# Patient Record
Sex: Female | Born: 1955 | Race: White | Hispanic: No | Marital: Married | Smoking: Never smoker
Health system: Southern US, Community
[De-identification: ages and names within clinical notes are randomized; demographics above are authoritative.]

## PROBLEM LIST (undated history)

## (undated) DIAGNOSIS — Z8052 Family history of malignant neoplasm of bladder: Secondary | ICD-10-CM

## (undated) DIAGNOSIS — D649 Anemia, unspecified: Secondary | ICD-10-CM

## (undated) DIAGNOSIS — G629 Polyneuropathy, unspecified: Secondary | ICD-10-CM

## (undated) DIAGNOSIS — L409 Psoriasis, unspecified: Secondary | ICD-10-CM

## (undated) DIAGNOSIS — Z800 Family history of malignant neoplasm of digestive organs: Secondary | ICD-10-CM

## (undated) DIAGNOSIS — E039 Hypothyroidism, unspecified: Secondary | ICD-10-CM

## (undated) DIAGNOSIS — C801 Malignant (primary) neoplasm, unspecified: Secondary | ICD-10-CM

## (undated) DIAGNOSIS — C50919 Malignant neoplasm of unspecified site of unspecified female breast: Secondary | ICD-10-CM

## (undated) DIAGNOSIS — M1990 Unspecified osteoarthritis, unspecified site: Secondary | ICD-10-CM

## (undated) DIAGNOSIS — K219 Gastro-esophageal reflux disease without esophagitis: Secondary | ICD-10-CM

## (undated) DIAGNOSIS — S63599A Other specified sprain of unspecified wrist, initial encounter: Secondary | ICD-10-CM

## (undated) DIAGNOSIS — H269 Unspecified cataract: Secondary | ICD-10-CM

## (undated) DIAGNOSIS — H409 Unspecified glaucoma: Secondary | ICD-10-CM

## (undated) HISTORY — DX: Family history of malignant neoplasm of digestive organs: Z80.0

## (undated) HISTORY — PX: DILATION AND CURETTAGE OF UTERUS: SHX78

## (undated) HISTORY — PX: KNEE SURGERY: SHX244

## (undated) HISTORY — PX: CHOLECYSTECTOMY: SHX55

## (undated) HISTORY — DX: Family history of malignant neoplasm of bladder: Z80.52

## (undated) HISTORY — PX: BREAST SURGERY: SHX581

## (undated) HISTORY — PX: BUNIONECTOMY: SHX129

## (undated) HISTORY — PX: ABDOMINAL HYSTERECTOMY: SHX81

## (undated) HISTORY — PX: KNEE ARTHROSCOPY: SUR90

## (undated) HISTORY — PX: CYST EXCISION: SHX5701

## (undated) HISTORY — DX: Malignant neoplasm of unspecified site of unspecified female breast: C50.919

---

## 1987-07-20 HISTORY — PX: PARTIAL HYSTERECTOMY: SHX80

## 2002-07-19 HISTORY — PX: FOOT SURGERY: SHX648

## 2010-05-05 ENCOUNTER — Encounter
Admission: RE | Admit: 2010-05-05 | Discharge: 2010-07-16 | Payer: Self-pay | Source: Home / Self Care | Attending: Podiatry | Admitting: Podiatry

## 2010-07-19 HISTORY — PX: HAND TENDON SURGERY: SHX663

## 2010-09-17 HISTORY — PX: CHOLECYSTECTOMY: SHX55

## 2011-05-27 ENCOUNTER — Other Ambulatory Visit: Payer: Self-pay | Admitting: Orthopedic Surgery

## 2011-05-27 DIAGNOSIS — M25532 Pain in left wrist: Secondary | ICD-10-CM

## 2011-06-15 ENCOUNTER — Ambulatory Visit
Admission: RE | Admit: 2011-06-15 | Discharge: 2011-06-15 | Disposition: A | Discharge status: Home / Self Care | Payer: BC Managed Care – PPO | Source: Ambulatory Visit | Attending: Orthopedic Surgery | Admitting: Orthopedic Surgery

## 2011-06-15 DIAGNOSIS — M25532 Pain in left wrist: Secondary | ICD-10-CM

## 2011-06-15 MED ORDER — IOHEXOL 180 MG/ML  SOLN
3.0000 mL | Freq: Once | INTRAMUSCULAR | Status: AC | PRN
  Administered 2011-06-15: 3 mL via INTRA_ARTICULAR

## 2011-06-28 ENCOUNTER — Encounter (HOSPITAL_BASED_OUTPATIENT_CLINIC_OR_DEPARTMENT_OTHER): Payer: Self-pay | Admitting: *Deleted

## 2011-06-29 ENCOUNTER — Encounter (HOSPITAL_BASED_OUTPATIENT_CLINIC_OR_DEPARTMENT_OTHER): Payer: Self-pay | Admitting: Anesthesiology

## 2011-06-29 ENCOUNTER — Encounter (HOSPITAL_BASED_OUTPATIENT_CLINIC_OR_DEPARTMENT_OTHER): Payer: Self-pay | Admitting: *Deleted

## 2011-06-29 ENCOUNTER — Encounter (HOSPITAL_BASED_OUTPATIENT_CLINIC_OR_DEPARTMENT_OTHER): Payer: Self-pay | Admitting: Orthopedic Surgery

## 2011-06-29 ENCOUNTER — Ambulatory Visit (HOSPITAL_BASED_OUTPATIENT_CLINIC_OR_DEPARTMENT_OTHER)
Admission: RE | Admit: 2011-06-29 | Discharge: 2011-06-29 | Disposition: A | Discharge status: Home / Self Care | Payer: BC Managed Care – PPO | Source: Ambulatory Visit | Attending: Orthopedic Surgery | Admitting: Orthopedic Surgery

## 2011-06-29 ENCOUNTER — Encounter (HOSPITAL_BASED_OUTPATIENT_CLINIC_OR_DEPARTMENT_OTHER)
Admission: RE | Disposition: A | Discharge status: Home / Self Care | Payer: Self-pay | Source: Ambulatory Visit | Attending: Orthopedic Surgery

## 2011-06-29 ENCOUNTER — Ambulatory Visit (HOSPITAL_BASED_OUTPATIENT_CLINIC_OR_DEPARTMENT_OTHER): Payer: BC Managed Care – PPO | Admitting: Anesthesiology

## 2011-06-29 DIAGNOSIS — K219 Gastro-esophageal reflux disease without esophagitis: Secondary | ICD-10-CM | POA: Insufficient documentation

## 2011-06-29 DIAGNOSIS — Y998 Other external cause status: Secondary | ICD-10-CM | POA: Insufficient documentation

## 2011-06-29 DIAGNOSIS — S63599A Other specified sprain of unspecified wrist, initial encounter: Secondary | ICD-10-CM | POA: Insufficient documentation

## 2011-06-29 DIAGNOSIS — Z5333 Arthroscopic surgical procedure converted to open procedure: Secondary | ICD-10-CM | POA: Insufficient documentation

## 2011-06-29 HISTORY — DX: Other specified sprain of unspecified wrist, initial encounter: S63.599A

## 2011-06-29 HISTORY — DX: Unspecified osteoarthritis, unspecified site: M19.90

## 2011-06-29 HISTORY — DX: Psoriasis, unspecified: L40.9

## 2011-06-29 HISTORY — DX: Gastro-esophageal reflux disease without esophagitis: K21.9

## 2011-06-29 HISTORY — PX: WRIST ARTHROSCOPY: SHX838

## 2011-06-29 LAB — POCT HEMOGLOBIN-HEMACUE: Hemoglobin: 14 g/dL (ref 12.0–15.0)

## 2011-06-29 SURGERY — ARTHROSCOPY, WRIST
Anesthesia: Choice | Site: Wrist | Laterality: Left | Wound class: Clean

## 2011-06-29 MED ORDER — PROPOFOL 10 MG/ML IV EMUL
INTRAVENOUS | Status: DC | PRN
  Administered 2011-06-29: 200 mg via INTRAVENOUS

## 2011-06-29 MED ORDER — FENTANYL CITRATE 0.05 MG/ML IJ SOLN: 25.0000 ug | INTRAMUSCULAR | Status: DC | PRN

## 2011-06-29 MED ORDER — EPHEDRINE SULFATE 50 MG/ML IJ SOLN
INTRAMUSCULAR | Status: DC | PRN
  Administered 2011-06-29: 10 mg via INTRAVENOUS

## 2011-06-29 MED ORDER — ROPIVACAINE HCL 5 MG/ML IJ SOLN
INTRAMUSCULAR | Status: DC | PRN
  Administered 2011-06-29: 25 mL via EPIDURAL

## 2011-06-29 MED ORDER — ONDANSETRON HCL 4 MG/2ML IJ SOLN
INTRAMUSCULAR | Status: DC | PRN
  Administered 2011-06-29: 4 mg via INTRAVENOUS

## 2011-06-29 MED ORDER — MIDAZOLAM HCL 2 MG/2ML IJ SOLN
0.5000 mg | INTRAMUSCULAR | Status: DC | PRN
  Administered 2011-06-29: 2 mg via INTRAVENOUS

## 2011-06-29 MED ORDER — ONDANSETRON HCL 4 MG PO TABS: 4.0000 mg | ORAL_TABLET | Freq: Three times a day (TID) | ORAL | Status: AC | PRN

## 2011-06-29 MED ORDER — LIDOCAINE HCL 1 % IJ SOLN
INTRAMUSCULAR | Status: DC | PRN
  Administered 2011-06-29: 2 mL via INTRADERMAL

## 2011-06-29 MED ORDER — ONDANSETRON HCL 4 MG PO TABS
4.0000 mg | ORAL_TABLET | Freq: Once | ORAL | Status: AC
  Administered 2011-06-29: 4 mg via ORAL

## 2011-06-29 MED ORDER — METOCLOPRAMIDE HCL 5 MG/ML IJ SOLN
10.0000 mg | Freq: Once | INTRAMUSCULAR | Status: AC | PRN
  Administered 2011-06-29: 10 mg via INTRAVENOUS

## 2011-06-29 MED ORDER — HYDROCODONE-ACETAMINOPHEN 5-325 MG PO TABS: ORAL_TABLET | ORAL | Status: DC

## 2011-06-29 MED ORDER — DEXAMETHASONE SODIUM PHOSPHATE 10 MG/ML IJ SOLN
INTRAMUSCULAR | Status: DC | PRN
  Administered 2011-06-29: 10 mg via INTRAVENOUS

## 2011-06-29 MED ORDER — LACTATED RINGERS IV SOLN
INTRAVENOUS | Status: DC
  Administered 2011-06-29 (×3): via INTRAVENOUS

## 2011-06-29 MED ORDER — FENTANYL CITRATE 0.05 MG/ML IJ SOLN
50.0000 ug | INTRAMUSCULAR | Status: DC | PRN
  Administered 2011-06-29: 100 ug via INTRAVENOUS

## 2011-06-29 SURGICAL SUPPLY — 91 items
ANCHOR JUGGERKNOT SHT 1.4 SOFT (Anchor) ×2 IMPLANT
BANDAGE ACE 4 STERILE (GAUZE/BANDAGES/DRESSINGS) ×2 IMPLANT
BANDAGE ELASTIC 3 VELCRO ST LF (GAUZE/BANDAGES/DRESSINGS) ×2 IMPLANT
BANDAGE ELASTIC 4 VELCRO ST LF (GAUZE/BANDAGES/DRESSINGS) IMPLANT
BANDAGE GAUZE ELAST BULKY 4 IN (GAUZE/BANDAGES/DRESSINGS) ×2 IMPLANT
BLADE CUDA 2.0 (BLADE) IMPLANT
BLADE EAR TYMPAN 2.5 60D BEAV (BLADE) IMPLANT
BLADE MINI RND TIP GREEN BEAV (BLADE) IMPLANT
BLADE SURG 15 STRL LF DISP TIS (BLADE) ×3 IMPLANT
BLADE SURG 15 STRL SS (BLADE) ×3
BNDG ELASTIC 2 VLCR STRL LF (GAUZE/BANDAGES/DRESSINGS) IMPLANT
BNDG ESMARK 4X9 LF (GAUZE/BANDAGES/DRESSINGS) ×2 IMPLANT
BNDG PLASTER X FAST 3X3 WHT LF (CAST SUPPLIES) ×2 IMPLANT
BUR CUDA 2.9 (BURR) IMPLANT
BUR FULL RADIUS 2.0 (BURR) IMPLANT
BUR FULL RADIUS 2.9 (BURR) IMPLANT
BUR GATOR 2.9 (BURR) IMPLANT
BUR SPHERICAL 2.9 (BURR) IMPLANT
CANISTER OMNI JUG 16 LITER (MISCELLANEOUS) ×2 IMPLANT
CANISTER SUCTION 1200CC (MISCELLANEOUS) IMPLANT
CANISTER SUCTION 2500CC (MISCELLANEOUS) IMPLANT
CHLORAPREP W/TINT 26ML (MISCELLANEOUS) ×2 IMPLANT
CLOTH BEACON ORANGE TIMEOUT ST (SAFETY) ×2 IMPLANT
CORDS BIPOLAR (ELECTRODE) ×2 IMPLANT
COVER MAYO STAND STRL (DRAPES) ×2 IMPLANT
COVER TABLE BACK 60X90 (DRAPES) ×2 IMPLANT
CUFF TOURNIQUET SINGLE 18IN (TOURNIQUET CUFF) IMPLANT
DRAPE EXTREMITY T 121X128X90 (DRAPE) ×2 IMPLANT
DRAPE OEC MINIVIEW 54X84 (DRAPES) IMPLANT
DRAPE SURG 17X23 STRL (DRAPES) ×2 IMPLANT
DRSG KUZMA FLUFF (GAUZE/BANDAGES/DRESSINGS) IMPLANT
ELECT SMALL JOINT 90D BASC (ELECTRODE) IMPLANT
GAUZE XEROFORM 1X8 LF (GAUZE/BANDAGES/DRESSINGS) ×2 IMPLANT
GLOVE BIO SURGEON STRL SZ 6 (GLOVE) ×2 IMPLANT
GLOVE BIO SURGEON STRL SZ 6.5 (GLOVE) ×2 IMPLANT
GLOVE BIO SURGEON STRL SZ7.5 (GLOVE) ×2 IMPLANT
GLOVE BIOGEL PI IND STRL 6.5 (GLOVE) ×1 IMPLANT
GLOVE BIOGEL PI INDICATOR 6.5 (GLOVE) ×1
GLOVE SURG ORTHO 8.0 STRL STRW (GLOVE) ×4 IMPLANT
GOWN PREVENTION PLUS XLARGE (GOWN DISPOSABLE) ×4 IMPLANT
GOWN STRL REIN XL XLG (GOWN DISPOSABLE) ×4 IMPLANT
IV NS IRRIG 3000ML ARTHROMATIC (IV SOLUTION) ×2 IMPLANT
IV SET EXTENSION GRAVITY 40 LF (IV SETS) ×2 IMPLANT
NDL SAFETY ECLIPSE 18X1.5 (NEEDLE) ×3 IMPLANT
NEEDLE FILTER BLUNT 18X 1/2SAF (NEEDLE)
NEEDLE FILTER BLUNT 18X1 1/2 (NEEDLE) IMPLANT
NEEDLE HYPO 18GX1.5 SHARP (NEEDLE) ×3
NEEDLE HYPO 22GX1.5 SAFETY (NEEDLE) ×2 IMPLANT
NEEDLE SPNL 18GX3.5 QUINCKE PK (NEEDLE) IMPLANT
NEEDLE TUOHY 20GX3.5 (NEEDLE) IMPLANT
NS IRRIG 1000ML POUR BTL (IV SOLUTION) IMPLANT
PACK BASIN DAY SURGERY FS (CUSTOM PROCEDURE TRAY) ×2 IMPLANT
PAD CAST 3X4 CTTN HI CHSV (CAST SUPPLIES) ×1 IMPLANT
PADDING CAST ABS 3INX4YD NS (CAST SUPPLIES) ×2
PADDING CAST ABS 4INX4YD NS (CAST SUPPLIES) ×1
PADDING CAST ABS COTTON 3X4 (CAST SUPPLIES) ×2 IMPLANT
PADDING CAST ABS COTTON 4X4 ST (CAST SUPPLIES) ×1 IMPLANT
PADDING CAST COTTON 3X4 STRL (CAST SUPPLIES) ×1
PADDING WEBRIL 3 STERILE (GAUZE/BANDAGES/DRESSINGS) ×2 IMPLANT
ROUTER HOODED VORTEX 2.9MM (BLADE) IMPLANT
SET ARTHROSCOPY TUBING (MISCELLANEOUS) ×1
SET ARTHROSCOPY TUBING LN (MISCELLANEOUS) ×1 IMPLANT
SET SM JOINT TUBING/CANN (CANNULA) IMPLANT
SLEEVE SCD COMPRESS KNEE MED (MISCELLANEOUS) ×2 IMPLANT
SPLINT PLASTER CAST XFAST 3X15 (CAST SUPPLIES) IMPLANT
SPLINT PLASTER CAST XFAST 4X15 (CAST SUPPLIES) IMPLANT
SPLINT PLASTER XTRA FAST SET 4 (CAST SUPPLIES)
SPLINT PLASTER XTRA FASTSET 3X (CAST SUPPLIES)
SPONGE GAUZE 4X4 12PLY (GAUZE/BANDAGES/DRESSINGS) ×2 IMPLANT
STOCKINETTE 4X48 STRL (DRAPES) ×2 IMPLANT
STRIP CLOSURE SKIN 1/2X4 (GAUZE/BANDAGES/DRESSINGS) IMPLANT
STRIP CLOSURE SKIN 1/4X4 (GAUZE/BANDAGES/DRESSINGS) IMPLANT
SUCTION FRAZIER TIP 10 FR DISP (SUCTIONS) IMPLANT
SUT ETHILON 4 0 PS 2 18 (SUTURE) ×2 IMPLANT
SUT ETHILON 5 0 P 3 18 (SUTURE)
SUT MERSILENE 4 0 P 3 (SUTURE) IMPLANT
SUT NYLON ETHILON 5-0 P-3 1X18 (SUTURE) IMPLANT
SUT PDS AB 2-0 CT2 27 (SUTURE) IMPLANT
SUT STEEL 4 0 (SUTURE) IMPLANT
SUT VIC AB 2-0 PS2 27 (SUTURE) IMPLANT
SUT VICRYL 3-0 RB1 (SUTURE) ×2 IMPLANT
SUT VICRYL 4-0 PS2 18IN ABS (SUTURE) IMPLANT
SUT VICRYL RAPID 5 0 P 3 (SUTURE) IMPLANT
SUT VICRYL RAPIDE 4/0 PS 2 (SUTURE) IMPLANT
SYR BULB 3OZ (MISCELLANEOUS) ×2 IMPLANT
SYR CONTROL 10ML LL (SYRINGE) ×2 IMPLANT
TRAP DIGIT (INSTRUMENTS) ×2 IMPLANT
TUBE CONNECTING 20X1/4 (TUBING) ×2 IMPLANT
UNDERPAD 30X30 INCONTINENT (UNDERPADS AND DIAPERS) ×2 IMPLANT
WAND 1.5 MICROBLATOR (SURGICAL WAND) IMPLANT
WATER STERILE IRR 1000ML POUR (IV SOLUTION) ×2 IMPLANT

## 2011-06-29 NOTE — Op Note (Signed)
Dictation [id number]

## 2011-06-29 NOTE — Progress Notes (Signed)
Assisted Dr. Frederick with left, ultrasound guided, supraclavicular block. Side rails up, monitors on throughout procedure. See vital signs in flow sheet. Tolerated Procedure well. 

## 2011-06-29 NOTE — Brief Op Note (Signed)
06/29/2011  3:34 PM  PATIENT:  Buel Ream  55 y.o. female  PRE-OPERATIVE DIAGNOSIS:  left wrist tfcc tear  POST-OPERATIVE DIAGNOSIS:  left wrist tfcc tear  PROCEDURE:  Procedure(s): ARTHROSCOPY WRIST  SURGEON:  Surgeon(s): Tami Ribas Nicki Reaper, MD  PHYSICIAN ASSISTANT:   ASSISTANTS: none   ANESTHESIA:   regional and general  EBL:  Total I/O In: 1700 [I.V.:1700] Out: -   BLOOD ADMINISTERED:none  DRAINS: none   LOCAL MEDICATIONS USED:  NONE  SPECIMEN:  No Specimen  DISPOSITION OF SPECIMEN:  N/A  COUNTS:  YES  TOURNIQUET:   Total Tourniquet Time Documented: Upper Arm (Left) - 44 minutes  DICTATION: .Other Dictation: Dictation Number [id number]  PLAN OF CARE: Discharge to home after PACU  PATIENT DISPOSITION:  PACU - hemodynamically stable.
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
## 2011-06-29 NOTE — Anesthesia Postprocedure Evaluation (Signed)
Anesthesia Post Note  Patient: Betty Henry  Procedure(s) Performed:  ARTHROSCOPY WRIST - left wrist tfcc repair  Anesthesia type: General  Patient location: PACU  Post pain: Pain level controlled  Post assessment: Patient's Cardiovascular Status Stable  Last Vitals:  Filed Vitals:   06/29/11 1614  BP: 142/87  Pulse: 75  Temp: 36.8 C  Resp: 16    Post vital signs: Reviewed and stable  Level of consciousness: alert  Complications: No apparent anesthesia complications

## 2011-06-29 NOTE — Anesthesia Preprocedure Evaluation (Signed)
Anesthesia Evaluation  Patient identified by MRN, date of birth, ID band Patient awake    Reviewed: Allergy & Precautions, H&P , NPO status , Patient's Chart, lab work & pertinent test results, reviewed documented beta blocker date and time   Airway Mallampati: II TM Distance: >3 FB Neck ROM: full    Dental   Pulmonary neg pulmonary ROS,          Cardiovascular neg cardio ROS     Neuro/Psych Negative Neurological ROS  Negative Psych ROS   GI/Hepatic Neg liver ROS, GERD-  ,  Endo/Other  Negative Endocrine ROS  Renal/GU negative Renal ROS  Genitourinary negative   Musculoskeletal   Abdominal   Peds  Hematology negative hematology ROS (+)   Anesthesia Other Findings See surgeon's H&P   Reproductive/Obstetrics negative OB ROS                           Anesthesia Physical Anesthesia Plan  ASA: II  Anesthesia Plan: General   Post-op Pain Management: MAC Combined w/ Regional for Post-op pain   Induction: Intravenous  Airway Management Planned: LMA  Additional Equipment:   Intra-op Plan:   Post-operative Plan: Extubation in OR  Informed Consent: I have reviewed the patients History and Physical, chart, labs and discussed the procedure including the risks, benefits and alternatives for the proposed anesthesia with the patient or authorized representative who has indicated his/her understanding and acceptance.     Plan Discussed with: CRNA and Surgeon  Anesthesia Plan Comments:         Anesthesia Quick Evaluation

## 2011-06-29 NOTE — Anesthesia Procedure Notes (Addendum)
Anesthesia Regional Block:  Supraclavicular block  Pre-Anesthetic Checklist: ,, timeout performed, Correct Patient, Correct Site, Correct Laterality, Correct Procedure, Correct Position, site marked, Risks and benefits discussed,  Surgical consent,  Pre-op evaluation,  At surgeon's request and post-op pain management  Laterality: Left  Prep: chloraprep       Needles:   Needle Type: Other   (Arrow Echogenic)   Needle Length: 9cm  Needle Gauge: 21    Additional Needles:  Procedures: ultrasound guided Supraclavicular block Narrative:  Start time: 06/29/2011 11:32 AM End time: 06/29/2011 1:40 PM Injection made incrementally with aspirations every 5 mL.  Performed by: Personally  Anesthesiologist: cfrederick  Additional Notes: Ultrasound guidance used to: id relevant anatomy, confirm needle position, local anesthetic spread, avoidance of vascular puncture. Picture saved. No complications.   Ultrasound guidance used to: id relevant anatomy, confirm needle position, local anesthetic spread, avoidance of vascular puncture. Picture saved. No complications. Block performed personally by Janetta Hora. Gelene Mink, MD    Supraclavicular block Procedure Name: LMA Insertion Date/Time: 06/29/2011 2:06 PM Performed by: Gladys Damme Pre-anesthesia Checklist: Patient identified, Timeout performed, Emergency Drugs available, Suction available and Patient being monitored Patient Re-evaluated:Patient Re-evaluated prior to inductionOxygen Delivery Method: Circle System Utilized Preoxygenation: Pre-oxygenation with 100% oxygen Intubation Type: IV induction Ventilation: Mask ventilation without difficulty LMA: LMA with gastric port inserted LMA Size: 4.0 Number of attempts: 1 Placement Confirmation: breath sounds checked- equal and bilateral and positive ETCO2 Tube secured with: Tape Dental Injury: Teeth and Oropharynx as per pre-operative assessment

## 2011-06-29 NOTE — Transfer of Care (Signed)
Immediate Anesthesia Transfer of Care Note  Patient: Betty Henry  Procedure(s) Performed:  ARTHROSCOPY WRIST - left wrist tfcc repair  Patient Location: PACU  Anesthesia Type: GA combined with regional for post-op pain  Level of Consciousness: awake and alert   Airway & Oxygen Therapy: Patient Spontanous Breathing and Patient connected to face mask oxygen  Post-op Assessment: Report given to PACU RN and Post -op Vital signs reviewed and stable  Post vital signs: Reviewed and stable  Complications: No apparent anesthesia complications
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
## 2011-07-01 ENCOUNTER — Encounter (HOSPITAL_BASED_OUTPATIENT_CLINIC_OR_DEPARTMENT_OTHER): Payer: Self-pay | Admitting: Orthopedic Surgery

## 2015-05-21 ENCOUNTER — Other Ambulatory Visit: Payer: Self-pay | Admitting: General Surgery

## 2015-06-04 ENCOUNTER — Other Ambulatory Visit: Payer: Self-pay | Admitting: General Surgery

## 2015-06-04 DIAGNOSIS — Z170 Estrogen receptor positive status [ER+]: Principal | ICD-10-CM

## 2015-06-04 DIAGNOSIS — C50911 Malignant neoplasm of unspecified site of right female breast: Secondary | ICD-10-CM

## 2015-06-06 ENCOUNTER — Other Ambulatory Visit: Payer: Self-pay | Admitting: General Surgery

## 2015-06-18 ENCOUNTER — Other Ambulatory Visit: Payer: Self-pay | Admitting: General Surgery

## 2015-06-18 ENCOUNTER — Other Ambulatory Visit: Payer: Self-pay | Admitting: Oncology

## 2015-06-18 DIAGNOSIS — C50919 Malignant neoplasm of unspecified site of unspecified female breast: Secondary | ICD-10-CM

## 2015-06-20 ENCOUNTER — Ambulatory Visit
Admission: RE | Admit: 2015-06-20 | Discharge: 2015-06-20 | Disposition: A | Discharge status: Home / Self Care | Payer: BLUE CROSS/BLUE SHIELD | Source: Ambulatory Visit | Attending: Oncology | Admitting: Oncology

## 2015-06-20 DIAGNOSIS — C50919 Malignant neoplasm of unspecified site of unspecified female breast: Secondary | ICD-10-CM

## 2015-06-20 MED ORDER — GADOBENATE DIMEGLUMINE 529 MG/ML IV SOLN
20.0000 mL | Freq: Once | INTRAVENOUS | Status: AC | PRN
  Administered 2015-06-20: 20 mL via INTRAVENOUS

## 2015-06-24 ENCOUNTER — Encounter (HOSPITAL_BASED_OUTPATIENT_CLINIC_OR_DEPARTMENT_OTHER): Payer: Self-pay | Admitting: *Deleted

## 2015-06-24 ENCOUNTER — Encounter (HOSPITAL_BASED_OUTPATIENT_CLINIC_OR_DEPARTMENT_OTHER): Payer: Self-pay | Admitting: Orthopedic Surgery

## 2015-06-27 ENCOUNTER — Encounter (HOSPITAL_BASED_OUTPATIENT_CLINIC_OR_DEPARTMENT_OTHER)
Admission: RE | Disposition: A | Discharge status: Home / Self Care | Payer: Self-pay | Source: Ambulatory Visit | Attending: General Surgery

## 2015-06-27 ENCOUNTER — Ambulatory Visit (HOSPITAL_BASED_OUTPATIENT_CLINIC_OR_DEPARTMENT_OTHER): Payer: BLUE CROSS/BLUE SHIELD | Admitting: Certified Registered"

## 2015-06-27 ENCOUNTER — Ambulatory Visit (HOSPITAL_BASED_OUTPATIENT_CLINIC_OR_DEPARTMENT_OTHER)
Admission: RE | Admit: 2015-06-27 | Discharge: 2015-06-27 | Disposition: A | Discharge status: Home / Self Care | Payer: BLUE CROSS/BLUE SHIELD | Source: Ambulatory Visit | Attending: General Surgery | Admitting: General Surgery

## 2015-06-27 ENCOUNTER — Ambulatory Visit (HOSPITAL_COMMUNITY): Payer: BLUE CROSS/BLUE SHIELD

## 2015-06-27 ENCOUNTER — Encounter (HOSPITAL_BASED_OUTPATIENT_CLINIC_OR_DEPARTMENT_OTHER): Payer: Self-pay | Admitting: Certified Registered"

## 2015-06-27 DIAGNOSIS — K219 Gastro-esophageal reflux disease without esophagitis: Secondary | ICD-10-CM | POA: Insufficient documentation

## 2015-06-27 DIAGNOSIS — C50911 Malignant neoplasm of unspecified site of right female breast: Secondary | ICD-10-CM | POA: Insufficient documentation

## 2015-06-27 DIAGNOSIS — L409 Psoriasis, unspecified: Secondary | ICD-10-CM | POA: Insufficient documentation

## 2015-06-27 DIAGNOSIS — M1990 Unspecified osteoarthritis, unspecified site: Secondary | ICD-10-CM | POA: Insufficient documentation

## 2015-06-27 DIAGNOSIS — Z6841 Body Mass Index (BMI) 40.0 and over, adult: Secondary | ICD-10-CM | POA: Diagnosis not present

## 2015-06-27 DIAGNOSIS — Z79899 Other long term (current) drug therapy: Secondary | ICD-10-CM | POA: Insufficient documentation

## 2015-06-27 DIAGNOSIS — E039 Hypothyroidism, unspecified: Secondary | ICD-10-CM | POA: Insufficient documentation

## 2015-06-27 DIAGNOSIS — Z90711 Acquired absence of uterus with remaining cervical stump: Secondary | ICD-10-CM | POA: Insufficient documentation

## 2015-06-27 DIAGNOSIS — Z9049 Acquired absence of other specified parts of digestive tract: Secondary | ICD-10-CM | POA: Diagnosis not present

## 2015-06-27 DIAGNOSIS — Z791 Long term (current) use of non-steroidal anti-inflammatories (NSAID): Secondary | ICD-10-CM | POA: Diagnosis not present

## 2015-06-27 DIAGNOSIS — Z95828 Presence of other vascular implants and grafts: Secondary | ICD-10-CM

## 2015-06-27 HISTORY — DX: Malignant (primary) neoplasm, unspecified: C80.1

## 2015-06-27 HISTORY — PX: PORTACATH PLACEMENT: SHX2246

## 2015-06-27 HISTORY — DX: Hypothyroidism, unspecified: E03.9

## 2015-06-27 SURGERY — INSERTION, TUNNELED CENTRAL VENOUS DEVICE, WITH PORT
Anesthesia: General | Site: Neck | Laterality: Left

## 2015-06-27 MED ORDER — HEPARIN SOD (PORK) LOCK FLUSH 100 UNIT/ML IV SOLN
INTRAVENOUS | Status: DC | PRN
  Administered 2015-06-27: 500 [IU] via INTRAVENOUS

## 2015-06-27 MED ORDER — HEPARIN (PORCINE) IN NACL 2-0.9 UNIT/ML-% IJ SOLN
INTRAMUSCULAR | Status: AC
  Filled 2015-06-27: qty 500

## 2015-06-27 MED ORDER — FENTANYL CITRATE (PF) 100 MCG/2ML IJ SOLN
25.0000 ug | INTRAMUSCULAR | Status: DC | PRN
  Administered 2015-06-27 (×2): 50 ug via INTRAVENOUS

## 2015-06-27 MED ORDER — FENTANYL CITRATE (PF) 100 MCG/2ML IJ SOLN
50.0000 ug | INTRAMUSCULAR | Status: DC | PRN
  Administered 2015-06-27 (×2): 50 ug via INTRAVENOUS

## 2015-06-27 MED ORDER — CEFAZOLIN SODIUM-DEXTROSE 2-3 GM-% IV SOLR
2.0000 g | INTRAVENOUS | Status: AC
  Administered 2015-06-27: 2 g via INTRAVENOUS

## 2015-06-27 MED ORDER — LIDOCAINE HCL (CARDIAC) 20 MG/ML IV SOLN
INTRAVENOUS | Status: DC | PRN
  Administered 2015-06-27: 80 mg via INTRAVENOUS

## 2015-06-27 MED ORDER — SODIUM BICARBONATE 4 % IV SOLN
INTRAVENOUS | Status: AC
  Filled 2015-06-27: qty 5

## 2015-06-27 MED ORDER — HEPARIN SOD (PORK) LOCK FLUSH 100 UNIT/ML IV SOLN
INTRAVENOUS | Status: AC
  Filled 2015-06-27: qty 5

## 2015-06-27 MED ORDER — CHLORHEXIDINE GLUCONATE 4 % EX LIQD: 1.0000 "application " | Freq: Once | CUTANEOUS | Status: DC

## 2015-06-27 MED ORDER — MIDAZOLAM HCL 2 MG/2ML IJ SOLN
1.0000 mg | INTRAMUSCULAR | Status: DC | PRN
  Administered 2015-06-27: 2 mg via INTRAVENOUS

## 2015-06-27 MED ORDER — LIDOCAINE HCL (PF) 1 % IJ SOLN
INTRAMUSCULAR | Status: AC
  Filled 2015-06-27: qty 30

## 2015-06-27 MED ORDER — HYDROCODONE-ACETAMINOPHEN 5-325 MG PO TABS: ORAL_TABLET | ORAL | Status: DC

## 2015-06-27 MED ORDER — ONDANSETRON HCL 4 MG/2ML IJ SOLN
INTRAMUSCULAR | Status: DC | PRN
  Administered 2015-06-27: 4 mg via INTRAVENOUS

## 2015-06-27 MED ORDER — PROPOFOL 10 MG/ML IV BOLUS
INTRAVENOUS | Status: DC | PRN
  Administered 2015-06-27: 200 mg via INTRAVENOUS

## 2015-06-27 MED ORDER — GLYCOPYRROLATE 0.2 MG/ML IJ SOLN: 0.2000 mg | Freq: Once | INTRAMUSCULAR | Status: DC | PRN

## 2015-06-27 MED ORDER — DEXTROSE 5 % IV SOLN: 3.0000 g | INTRAVENOUS | Status: DC

## 2015-06-27 MED ORDER — HEPARIN (PORCINE) IN NACL 2-0.9 UNIT/ML-% IJ SOLN
INTRAMUSCULAR | Status: DC | PRN
  Administered 2015-06-27: 1 via INTRAVENOUS

## 2015-06-27 MED ORDER — LACTATED RINGERS IV SOLN
INTRAVENOUS | Status: DC
  Administered 2015-06-27 (×2): via INTRAVENOUS

## 2015-06-27 MED ORDER — SCOPOLAMINE 1 MG/3DAYS TD PT72: 1.0000 | MEDICATED_PATCH | Freq: Once | TRANSDERMAL | Status: DC

## 2015-06-27 MED ORDER — DEXAMETHASONE SODIUM PHOSPHATE 4 MG/ML IJ SOLN
INTRAMUSCULAR | Status: DC | PRN
  Administered 2015-06-27: 10 mg via INTRAVENOUS

## 2015-06-27 MED ORDER — BUPIVACAINE-EPINEPHRINE 0.25% -1:200000 IJ SOLN
INTRAMUSCULAR | Status: DC | PRN
  Administered 2015-06-27: 25 mL

## 2015-06-27 MED ORDER — PROMETHAZINE HCL 25 MG/ML IJ SOLN
6.2500 mg | INTRAMUSCULAR | Status: DC | PRN
  Administered 2015-06-27: 6.25 mg via INTRAVENOUS

## 2015-06-27 MED ORDER — BUPIVACAINE-EPINEPHRINE (PF) 0.25% -1:200000 IJ SOLN
INTRAMUSCULAR | Status: AC
  Filled 2015-06-27: qty 30

## 2015-06-27 SURGICAL SUPPLY — 38 items
BAG DECANTER FOR FLEXI CONT (MISCELLANEOUS) ×3 IMPLANT
BANDAGE ADH SHEER 1  50/CT (GAUZE/BANDAGES/DRESSINGS) ×3 IMPLANT
BLADE SURG 15 STRL LF DISP TIS (BLADE) ×1 IMPLANT
BLADE SURG 15 STRL SS (BLADE) ×2
CHLORAPREP W/TINT 26ML (MISCELLANEOUS) ×3 IMPLANT
COVER BACK TABLE 60X90IN (DRAPES) ×3 IMPLANT
COVER MAYO STAND STRL (DRAPES) ×3 IMPLANT
DECANTER SPIKE VIAL GLASS SM (MISCELLANEOUS) IMPLANT
DRAPE C-ARM 42X72 X-RAY (DRAPES) ×3 IMPLANT
DRAPE LAPAROTOMY T 102X78X121 (DRAPES) ×3 IMPLANT
DRAPE UTILITY XL STRL (DRAPES) ×3 IMPLANT
ELECT REM PT RETURN 9FT ADLT (ELECTROSURGICAL) ×3
ELECTRODE REM PT RTRN 9FT ADLT (ELECTROSURGICAL) ×1 IMPLANT
GLOVE BIOGEL PI IND STRL 8 (GLOVE) ×1 IMPLANT
GLOVE BIOGEL PI INDICATOR 8 (GLOVE) ×2
GLOVE ECLIPSE 7.5 STRL STRAW (GLOVE) ×3 IMPLANT
GOWN STRL REUS W/ TWL LRG LVL3 (GOWN DISPOSABLE) ×1 IMPLANT
GOWN STRL REUS W/ TWL XL LVL3 (GOWN DISPOSABLE) ×1 IMPLANT
GOWN STRL REUS W/TWL LRG LVL3 (GOWN DISPOSABLE) ×2
GOWN STRL REUS W/TWL XL LVL3 (GOWN DISPOSABLE) ×2
IV CATH PLACEMENT UNIT 16 GA (IV SOLUTION) IMPLANT
IV KIT MINILOC 20X1 SAFETY (NEEDLE) IMPLANT
KIT PORT POWER 8FR ISP CVUE (Catheter) ×3 IMPLANT
LIQUID BAND (GAUZE/BANDAGES/DRESSINGS) ×3 IMPLANT
NEEDLE HYPO 22GX1.5 SAFETY (NEEDLE) ×3 IMPLANT
NEEDLE HYPO 25X1 1.5 SAFETY (NEEDLE) ×3 IMPLANT
PACK BASIN DAY SURGERY FS (CUSTOM PROCEDURE TRAY) ×3 IMPLANT
PENCIL BUTTON HOLSTER BLD 10FT (ELECTRODE) ×3 IMPLANT
SET SHEATH INTRODUCER 10FR (MISCELLANEOUS) IMPLANT
SHEATH COOK PEEL AWAY SET 9F (SHEATH) IMPLANT
SLEEVE SCD COMPRESS KNEE MED (MISCELLANEOUS) IMPLANT
SUT MON AB 4-0 PC3 18 (SUTURE) ×3 IMPLANT
SUT PROLENE 2 0 CT2 30 (SUTURE) ×3 IMPLANT
SUT SILK 4 0 TIES 17X18 (SUTURE) IMPLANT
SYR 5ML LUER SLIP (SYRINGE) ×3 IMPLANT
SYR CONTROL 10ML LL (SYRINGE) ×3 IMPLANT
TOWEL OR 17X24 6PK STRL BLUE (TOWEL DISPOSABLE) ×6 IMPLANT
TOWEL OR NON WOVEN STRL DISP B (DISPOSABLE) ×3 IMPLANT

## 2015-06-27 NOTE — Anesthesia Preprocedure Evaluation (Signed)
Anesthesia Evaluation  Patient identified by MRN, date of birth, ID band Patient awake    Reviewed: Allergy & Precautions, NPO status , Patient's Chart, lab work & pertinent test results  Airway Mallampati: II  TM Distance: >3 FB Neck ROM: Full    Dental no notable dental hx.    Pulmonary neg pulmonary ROS,    Pulmonary exam normal breath sounds clear to auscultation       Cardiovascular negative cardio ROS Normal cardiovascular exam Rhythm:Regular Rate:Normal     Neuro/Psych negative neurological ROS  negative psych ROS   GI/Hepatic Neg liver ROS, GERD  Medicated,  Endo/Other  Hypothyroidism Morbid obesity  Renal/GU negative Renal ROS  negative genitourinary   Musculoskeletal negative musculoskeletal ROS (+)   Abdominal   Peds negative pediatric ROS (+)  Hematology negative hematology ROS (+)   Anesthesia Other Findings   Reproductive/Obstetrics negative OB ROS                             Anesthesia Physical Anesthesia Plan  ASA: III  Anesthesia Plan: General   Post-op Pain Management:    Induction: Intravenous  Airway Management Planned: LMA  Additional Equipment:   Intra-op Plan:   Post-operative Plan: Extubation in OR  Informed Consent: I have reviewed the patients History and Physical, chart, labs and discussed the procedure including the risks, benefits and alternatives for the proposed anesthesia with the patient or authorized representative who has indicated his/her understanding and acceptance.   Dental advisory given  Plan Discussed with: CRNA and Surgeon  Anesthesia Plan Comments:         Anesthesia Quick Evaluation

## 2015-06-27 NOTE — Discharge Instructions (Signed)

## 2015-06-27 NOTE — Transfer of Care (Signed)
Immediate Anesthesia Transfer of Care Note  Patient: Betty Henry  Procedure(s) Performed: Procedure(s): INSERTION PORT-A-CATH (Left)  Patient Location: PACU  Anesthesia Type:General  Level of Consciousness: awake, alert  and patient cooperative  Airway & Oxygen Therapy: Patient Spontanous Breathing and Patient connected to face mask oxygen  Post-op Assessment: Report given to RN, Post -op Vital signs reviewed and stable and Patient moving all extremities  Post vital signs: Reviewed and stable  Last Vitals:  Filed Vitals:   06/27/15 0710  BP: 152/76  Pulse: 59  Temp: 36.4 C  Resp: 20    Complications: No apparent anesthesia complications

## 2015-06-27 NOTE — Anesthesia Procedure Notes (Signed)
Procedure Name: LMA Insertion Date/Time: 06/27/2015 8:27 AM Performed by: Baxter Flattery Pre-anesthesia Checklist: Patient identified, Emergency Drugs available, Suction available and Patient being monitored Patient Re-evaluated:Patient Re-evaluated prior to inductionOxygen Delivery Method: Circle System Utilized Preoxygenation: Pre-oxygenation with 100% oxygen Intubation Type: IV induction Ventilation: Mask ventilation without difficulty LMA: LMA inserted LMA Size: 4.0 Number of attempts: 1 Airway Equipment and Method: Bite block Placement Confirmation: positive ETCO2 and breath sounds checked- equal and bilateral Tube secured with: Tape Dental Injury: Teeth and Oropharynx as per pre-operative assessment

## 2015-06-27 NOTE — Anesthesia Postprocedure Evaluation (Signed)
Anesthesia Post Note  Patient: Betty Henry  Procedure(s) Performed: Procedure(s) (LRB): INSERTION PORT-A-CATH (Left)  Patient location during evaluation: PACU Anesthesia Type: General Level of consciousness: awake and alert Pain management: pain level controlled Vital Signs Assessment: post-procedure vital signs reviewed and stable Respiratory status: spontaneous breathing and respiratory function stable Cardiovascular status: blood pressure returned to baseline and stable Postop Assessment: no signs of nausea or vomiting Anesthetic complications: no    Last Vitals:  Filed Vitals:   06/27/15 0914 06/27/15 0915  BP: 139/71 133/78  Pulse: 92 89  Temp: 36.4 C   Resp: 16 16    Last Pain:  Filed Vitals:   06/27/15 0926  PainSc: 0-No pain                 [name] S

## 2015-06-27 NOTE — Op Note (Signed)
Preoperative diagnosis: Cancer of the breast and the poor venous access  Postoperative diagnosis: Same  Procedure: Placement of ClearVue subcutaneous venous port  Surgeon: Excell Seltzer M.D.  Anesthesia: LMA general  Description of procedure: Patient is brought to the operating room and placed in the supine position on the operating table. IV sedation was administered. The entire upper chest and neck were widely sterilely prepped and draped. Local anesthesia was used to infiltrate the insertion of port site. The left subclavian vein was cannulated with a needle and guidewire without difficulty and position in the superior vena cava was confirmed by fluoroscopy. The introducer was then placed over the guidewire and the flushed catheter placed via the introducer which was stripped away and the tip of the catheter positioned near the cavoatrial junction. A small transverse incision was made in the anterior chest wall and subcutaneous pocket created. The catheter was tunneled into the pocket, trimmed to length, and attached to the flushed port which was positioned in the pocket. The port was sutured to the chest wall with interrupted 2-0 Prolene. The incisions were closed with subcutaneous interrupted Monocryl and the skin incisions closed with subcuticular Monocryl and Dermabond. The port was accessed and flushed and aspirated easily and was left flushed with concentrated heparin solution. Sponge needle as the counts were correct. The patient was taken to recovery in good condition.  Jari Carollo T  06/27/2015

## 2015-06-27 NOTE — H&P (Signed)
Betty Henry is an 59 y.o. female.    HPI:  Patient has a recent diagnosis of triple negative invasive ductal carcinoma of the right breast. After consultation with medical oncology we have elected to proceed with neoadjuvant chemotherapy. She presents for Port-A-Cath placement.  Past Medical History  Diagnosis Date  . Arthritis     knees  . Psoriasis     elbows, knees  . Achilles tendon pain   . TFCC (triangular fibrocartilage complex) tear     left  . Hypothyroidism   . GERD (gastroesophageal reflux disease)   . Cancer ([place])     right breast  . Psoriasis     bil legs, elbows and hands    Past Surgical History  Procedure Laterality Date  . Cholecystectomy  09/2010  . Knee surgery  2000, 2008    left  . Foot surgery  2004    left  . Partial hysterectomy  1989  . Wrist arthroscopy  06/29/2011    Procedure: ARTHROSCOPY WRIST;  Surgeon: Tennis Must;  Location: [facility];  Service: Orthopedics;  Laterality: Left;  left wrist tfcc repair  . Cholecystectomy    . Abdominal hysterectomy      partial  . Bunionectomy Left   . Cyst excision Left     wrist  . Hand tendon surgery Left 2012  . Knee arthroscopy Left     x2    History reviewed. No pertinent family history. Social History:  reports that she has never smoked. She does not have any smokeless tobacco history on file. She reports that she does not drink alcohol or use illicit drugs.  Allergies:  Allergies  Allergen Reactions  . Ciprofloxacin Other (See Comments)    Abdominal cramping  . Codeine Nausea And Vomiting    Can take with nausea med  . Adhesive [Tape] Rash  . Codeine Nausea And Vomiting    Medications Prior to Admission  Medication Sig Dispense Refill  . acetaminophen (TYLENOL) 500 MG tablet Take 500 mg by mouth every 6 (six) hours as needed.    . cholecalciferol (VITAMIN D) 1000 UNITS tablet Take 1,000 Units by mouth daily.    Marland Kitchen esomeprazole (NEXIUM) 40 MG capsule Take 40 mg by mouth  daily at 12 noon.    Marland Kitchen HYDROcodone-acetaminophen (NORCO) 5-325 MG per tablet 1-2 tabs po q6 hours prn pain 40 tablet 0  . levothyroxine (SYNTHROID, LEVOTHROID) 75 MCG tablet Take 75 mcg by mouth daily before breakfast.    . loperamide (IMODIUM A-D) 2 MG tablet Take 2 mg by mouth as needed for diarrhea or loose stools.    . Vitamin D, Ergocalciferol, (DRISDOL) 50000 UNITS CAPS Take 50,000 Units by mouth every 7 (seven) days.      . diclofenac sodium (VOLTAREN) 1 % GEL Apply topically.      . meloxicam (MOBIC) 15 MG tablet Take 15 mg by mouth daily.    . tazarotene (AVAGE) 0.1 % cream Apply topically at bedtime.        No results found for this or any previous visit (from the past 48 hour(s)). No results found.  Review of Systems  Constitutional: Negative for fever.  Respiratory: Negative.   Cardiovascular: Negative.     Blood pressure 152/76, pulse 59, temperature 97.6 F (36.4 C), temperature source Oral, resp. rate 20, height 5\' 1"  (1.549 m), weight 103.42 kg (228 lb), SpO2 99 %. Physical Exam  General: Obese otherwise well-appearing Caucasian female Skin: No rash or infection  Lungs: Clear equal breath sounds bilaterally. Cardiac: Regular rate and rhythm. Extremities: No swelling or deformity Neurologic: Alert and fully oriented and affect normal  Assessment/Plan New diagnosis clinical stage I triple negative cancer of the right breast. For neoadjuvant chemotherapy. Presents for Port-A-Cath placement. I discussed the procedure with the patient including its indications and nature as well as risks of anesthetic complications, bleeding, infection, pneumothorax and long-term risks of catheter malfunction, occlusion and DVT. She understands and agrees to proceed.  Clementina Mareno T 06/27/2015, 8:15 AM

## 2015-06-30 ENCOUNTER — Encounter (HOSPITAL_BASED_OUTPATIENT_CLINIC_OR_DEPARTMENT_OTHER): Payer: Self-pay | Admitting: General Surgery

## 2015-07-20 DIAGNOSIS — Z9221 Personal history of antineoplastic chemotherapy: Secondary | ICD-10-CM

## 2015-07-20 DIAGNOSIS — C50919 Malignant neoplasm of unspecified site of unspecified female breast: Secondary | ICD-10-CM

## 2015-07-20 DIAGNOSIS — Z923 Personal history of irradiation: Secondary | ICD-10-CM

## 2015-07-20 HISTORY — DX: Malignant neoplasm of unspecified site of unspecified female breast: C50.919

## 2015-07-20 HISTORY — PX: BREAST LUMPECTOMY: SHX2

## 2015-07-20 HISTORY — DX: Personal history of irradiation: Z92.3

## 2015-07-20 HISTORY — DX: Personal history of antineoplastic chemotherapy: Z92.21

## 2015-10-14 ENCOUNTER — Other Ambulatory Visit: Payer: Self-pay | Admitting: General Surgery

## 2015-10-14 DIAGNOSIS — C50411 Malignant neoplasm of upper-outer quadrant of right female breast: Secondary | ICD-10-CM

## 2015-11-03 ENCOUNTER — Ambulatory Visit
Admission: RE | Admit: 2015-11-03 | Discharge: 2015-11-03 | Disposition: A | Discharge status: Home / Self Care | Payer: BLUE CROSS/BLUE SHIELD | Source: Ambulatory Visit | Attending: General Surgery | Admitting: General Surgery

## 2015-11-03 DIAGNOSIS — C50411 Malignant neoplasm of upper-outer quadrant of right female breast: Secondary | ICD-10-CM

## 2015-11-03 MED ORDER — GADOBENATE DIMEGLUMINE 529 MG/ML IV SOLN
20.0000 mL | Freq: Once | INTRAVENOUS | Status: AC | PRN
  Administered 2015-11-03: 20 mL via INTRAVENOUS

## 2015-11-21 ENCOUNTER — Other Ambulatory Visit: Payer: Self-pay | Admitting: General Surgery

## 2015-11-21 DIAGNOSIS — C50411 Malignant neoplasm of upper-outer quadrant of right female breast: Secondary | ICD-10-CM

## 2015-11-27 ENCOUNTER — Other Ambulatory Visit: Payer: Self-pay | Admitting: General Surgery

## 2015-11-27 DIAGNOSIS — C50411 Malignant neoplasm of upper-outer quadrant of right female breast: Secondary | ICD-10-CM

## 2015-12-08 ENCOUNTER — Encounter (HOSPITAL_BASED_OUTPATIENT_CLINIC_OR_DEPARTMENT_OTHER): Payer: Self-pay | Admitting: *Deleted

## 2015-12-10 ENCOUNTER — Ambulatory Visit
Admission: RE | Admit: 2015-12-10 | Discharge: 2015-12-10 | Disposition: A | Discharge status: Home / Self Care | Payer: BLUE CROSS/BLUE SHIELD | Source: Ambulatory Visit | Attending: General Surgery | Admitting: General Surgery

## 2015-12-10 DIAGNOSIS — C50411 Malignant neoplasm of upper-outer quadrant of right female breast: Secondary | ICD-10-CM

## 2015-12-10 NOTE — Pre-Procedure Instructions (Signed)
Pt in to pick up boost breeze drink and instructions reviewed. Pt supplied copies of her EKG and labs, placed in chart.

## 2015-12-11 NOTE — H&P (Signed)
  History of Present Illness Marland Kitchen T. Tunisia Landgrebe MD; 11/21/2015 1:33 PM) The patient is a 60 year old female who presents with breast cancer. She returns for surgical treatment planning status post neoadjuvant chemotherapy for triple negative cancer of the right breast. She is a premenopausal female who was referred by Dr. Theda Sers for evaluation of 4 carcinoma of the right breast. She initially had some pain in her left breast and was referred for imaging which revealed a new 2 cm mass in the right breast. She underwent core biopsy revealing a triple negative cancer. Lymph nodes clinically negative. She subsequently has completed her chemotherapy 2 weeks ago. 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  She subsequently has had an MRI on November 03, 2015 which has shown complete imaging resolution of the previously seen mass in the posterior upper outer quadrant of the right breast. Lymph nodes appear normal.      Allergies (Sonya Bynum, CMA; 11/21/2015 11:41 AM) Codeine and Related Cipro *FLUOROQUINOLONES* Adhesive Bandages Fexible *MEDICAL DEVICES AND SUPPLIES* Codeine Phosphate *ANALGESICS - OPIOID*  Medication History (Sonya Bynum, CMA; 11/21/2015 11:42 AM) Vitamin D (Cholecalciferol) (400UNIT Tablet Chewable, Oral) Active. Tylenol (500MG  Capsule, Oral) Active. Phenazopyridine HCl (200MG  Tablet, Oral) Active. NexIUM (40MG  Capsule DR, Oral) Active. Levothyroxine Sodium (75MCG Tablet, Oral) Active. Meloxicam (15MG  Tablet, Oral) Active. Gabapentin (300MG  Capsule, Oral) Active. LevoFLOXacin (750MG  Tablet, Oral) Active. RifAMPin (150MG  Capsule, Oral) Active. Medications Reconciled  Vitals (Sonya Bynum CMA; 11/21/2015 11:41 AM) 11/21/2015 11:41 AM Weight: 226 lb Height: 61in Body Surface Area: 1.99 m Body Mass Index: 42.7 kg/m  Temp.: 97.61F(Temporal)  Pulse: 81 (Regular)   BP: 136/80 (Sitting, Left Arm, Standard)       Physical Exam Marland Kitchen T. Genetta Fiero MD; 11/21/2015 1:35 PM) The physical exam findings are as follows: Note:General: Alert, obese Caucasian female, in no distress Skin: Warm and dry without rash or infection. HEENT: No palpable masses or thyromegaly. Sclera nonicteric. Pupils equal round and reactive. Lymph nodes: No cervical, supraclavicular, nodes palpable. Breasts: No palpable masses in either breast. No palpable axillary adenopathy Lungs: Breath sounds clear and equal. No wheezing or increased work of breathing. Cardiovascular: Regular rate and rhythm without murmer. No JVD or edema. Abdomen: Nondistended. Soft and nontender. No masses palpable. No organomegaly. No palpable hernias. Extremities: No edema or joint swelling or deformity. No chronic venous stasis changes. Neurologic: Alert and fully oriented. Gait normal. No focal weakness. Psychiatric: Normal mood and affect. Thought content appropriate with normal judgement and insight    Assessment & Plan Marland Kitchen T. 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 We discussed proceeding with surgery in the next 2-4 weeks. We discussed radioactive seed localized right breast lumpectomy and right axillary sentinel lymph node biopsy. We may remove her Port-A-Cath if her oncologist desires. We discussed the nature of surgery and expected recovery as well as risks of anesthetic complications, bleeding, infection, lymphedema and possible need for further surgery based on final margins. All her questions were answered.  Current Plans Radioactive seed localized right breast lumpectomy and right axillary sentinel  lymph node biopsy and possible removal of Port-A-Cath under general anesthesia as an outpatient

## 2015-12-12 ENCOUNTER — Ambulatory Visit (HOSPITAL_BASED_OUTPATIENT_CLINIC_OR_DEPARTMENT_OTHER): Payer: BLUE CROSS/BLUE SHIELD | Admitting: Anesthesiology

## 2015-12-12 ENCOUNTER — Ambulatory Visit (HOSPITAL_BASED_OUTPATIENT_CLINIC_OR_DEPARTMENT_OTHER)
Admission: RE | Admit: 2015-12-12 | Discharge: 2015-12-12 | Disposition: A | Discharge status: Home / Self Care | Payer: BLUE CROSS/BLUE SHIELD | Source: Ambulatory Visit | Attending: General Surgery | Admitting: General Surgery

## 2015-12-12 ENCOUNTER — Ambulatory Visit
Admission: RE | Admit: 2015-12-12 | Discharge: 2015-12-12 | Disposition: A | Discharge status: Home / Self Care | Payer: BLUE CROSS/BLUE SHIELD | Source: Ambulatory Visit | Attending: General Surgery | Admitting: General Surgery

## 2015-12-12 ENCOUNTER — Encounter (HOSPITAL_BASED_OUTPATIENT_CLINIC_OR_DEPARTMENT_OTHER)
Admission: RE | Disposition: A | Discharge status: Home / Self Care | Payer: Self-pay | Source: Ambulatory Visit | Attending: General Surgery

## 2015-12-12 ENCOUNTER — Encounter (HOSPITAL_BASED_OUTPATIENT_CLINIC_OR_DEPARTMENT_OTHER): Payer: Self-pay | Admitting: *Deleted

## 2015-12-12 ENCOUNTER — Encounter (HOSPITAL_COMMUNITY)
Admission: RE | Admit: 2015-12-12 | Discharge: 2015-12-12 | Disposition: A | Discharge status: Home / Self Care | Payer: BLUE CROSS/BLUE SHIELD | Source: Ambulatory Visit | Attending: General Surgery | Admitting: General Surgery

## 2015-12-12 DIAGNOSIS — C50411 Malignant neoplasm of upper-outer quadrant of right female breast: Secondary | ICD-10-CM | POA: Insufficient documentation

## 2015-12-12 DIAGNOSIS — N6031 Fibrosclerosis of right breast: Secondary | ICD-10-CM | POA: Insufficient documentation

## 2015-12-12 HISTORY — PX: BREAST LUMPECTOMY WITH RADIOACTIVE SEED AND SENTINEL LYMPH NODE BIOPSY: SHX6550

## 2015-12-12 HISTORY — PX: PORT-A-CATH REMOVAL: SHX5289

## 2015-12-12 SURGERY — BREAST LUMPECTOMY WITH RADIOACTIVE SEED AND SENTINEL LYMPH NODE BIOPSY
Anesthesia: General | Site: Chest | Laterality: Right

## 2015-12-12 MED ORDER — SODIUM CHLORIDE 0.9 % IJ SOLN
INTRAMUSCULAR | Status: AC
  Filled 2015-12-12: qty 10

## 2015-12-12 MED ORDER — LIDOCAINE HCL (CARDIAC) 20 MG/ML IV SOLN
INTRAVENOUS | Status: DC | PRN
  Administered 2015-12-12: 60 mg via INTRAVENOUS

## 2015-12-12 MED ORDER — ONDANSETRON HCL 4 MG/2ML IJ SOLN
INTRAMUSCULAR | Status: AC
  Filled 2015-12-12: qty 2

## 2015-12-12 MED ORDER — EPHEDRINE 5 MG/ML INJ
INTRAVENOUS | Status: AC
  Filled 2015-12-12: qty 10

## 2015-12-12 MED ORDER — FENTANYL CITRATE (PF) 100 MCG/2ML IJ SOLN
INTRAMUSCULAR | Status: AC
  Filled 2015-12-12: qty 2

## 2015-12-12 MED ORDER — TECHNETIUM TC 99M SULFUR COLLOID FILTERED
1.0000 | Freq: Once | INTRAVENOUS | Status: AC | PRN
Start: 2015-12-12 — End: 2015-12-12
  Administered 2015-12-12: 1 via INTRADERMAL

## 2015-12-12 MED ORDER — ONDANSETRON HCL 4 MG/2ML IJ SOLN
4.0000 mg | Freq: Once | INTRAMUSCULAR | Status: AC
  Administered 2015-12-12: 4 mg via INTRAVENOUS

## 2015-12-12 MED ORDER — LACTATED RINGERS IV SOLN
INTRAVENOUS | Status: DC
  Administered 2015-12-12 (×2): via INTRAVENOUS

## 2015-12-12 MED ORDER — BUPIVACAINE-EPINEPHRINE (PF) 0.5% -1:200000 IJ SOLN
INTRAMUSCULAR | Status: AC
  Filled 2015-12-12: qty 30

## 2015-12-12 MED ORDER — FENTANYL CITRATE (PF) 100 MCG/2ML IJ SOLN
25.0000 ug | INTRAMUSCULAR | Status: DC | PRN
  Administered 2015-12-12: 50 ug via INTRAVENOUS
  Administered 2015-12-12 (×2): 25 ug via INTRAVENOUS

## 2015-12-12 MED ORDER — CHLORHEXIDINE GLUCONATE 4 % EX LIQD: 1.0000 "application " | Freq: Once | CUTANEOUS | Status: DC

## 2015-12-12 MED ORDER — FENTANYL CITRATE (PF) 100 MCG/2ML IJ SOLN
50.0000 ug | INTRAMUSCULAR | Status: AC | PRN
  Administered 2015-12-12 (×3): 25 ug via INTRAVENOUS
  Administered 2015-12-12: 50 ug via INTRAVENOUS
  Administered 2015-12-12: 100 ug via INTRAVENOUS
  Administered 2015-12-12: 25 ug via INTRAVENOUS
  Administered 2015-12-12: 50 ug via INTRAVENOUS

## 2015-12-12 MED ORDER — METHYLENE BLUE 0.5 % INJ SOLN
INTRAVENOUS | Status: AC
  Filled 2015-12-12: qty 10

## 2015-12-12 MED ORDER — ONDANSETRON HCL 4 MG/2ML IJ SOLN
INTRAMUSCULAR | Status: DC | PRN
  Administered 2015-12-12: 4 mg via INTRAVENOUS

## 2015-12-12 MED ORDER — DEXAMETHASONE SODIUM PHOSPHATE 10 MG/ML IJ SOLN
INTRAMUSCULAR | Status: DC | PRN
  Administered 2015-12-12: 10 mg via INTRAVENOUS

## 2015-12-12 MED ORDER — ACETAMINOPHEN 10 MG/ML IV SOLN
INTRAVENOUS | Status: DC | PRN
  Administered 2015-12-12: 1000 mg via INTRAVENOUS

## 2015-12-12 MED ORDER — GLYCOPYRROLATE 0.2 MG/ML IJ SOLN
0.2000 mg | Freq: Once | INTRAMUSCULAR | Status: DC | PRN
Start: 2015-12-12 — End: 2015-12-12

## 2015-12-12 MED ORDER — SCOPOLAMINE 1 MG/3DAYS TD PT72: 1.0000 | MEDICATED_PATCH | Freq: Once | TRANSDERMAL | Status: DC | PRN

## 2015-12-12 MED ORDER — HYDROCODONE-ACETAMINOPHEN 5-325 MG PO TABS: 1.0000 | ORAL_TABLET | ORAL | Status: DC | PRN

## 2015-12-12 MED ORDER — MIDAZOLAM HCL 2 MG/2ML IJ SOLN
1.0000 mg | INTRAMUSCULAR | Status: DC | PRN
  Administered 2015-12-12: 2 mg via INTRAVENOUS

## 2015-12-12 MED ORDER — BUPIVACAINE-EPINEPHRINE (PF) 0.25% -1:200000 IJ SOLN
INTRAMUSCULAR | Status: AC
  Filled 2015-12-12: qty 30

## 2015-12-12 MED ORDER — DEXAMETHASONE SODIUM PHOSPHATE 10 MG/ML IJ SOLN
INTRAMUSCULAR | Status: AC
  Filled 2015-12-12: qty 1

## 2015-12-12 MED ORDER — ACETAMINOPHEN 10 MG/ML IV SOLN
INTRAVENOUS | Status: AC
  Filled 2015-12-12: qty 100

## 2015-12-12 MED ORDER — LIDOCAINE 2% (20 MG/ML) 5 ML SYRINGE
INTRAMUSCULAR | Status: AC
  Filled 2015-12-12: qty 5

## 2015-12-12 MED ORDER — PROMETHAZINE HCL 12.5 MG PO TABS
12.5000 mg | ORAL_TABLET | Freq: Four times a day (QID) | ORAL | Status: DC | PRN
Start: 2015-12-12 — End: 2016-04-15

## 2015-12-12 MED ORDER — CEFAZOLIN SODIUM-DEXTROSE 2-4 GM/100ML-% IV SOLN
INTRAVENOUS | Status: AC
  Filled 2015-12-12: qty 100

## 2015-12-12 MED ORDER — MIDAZOLAM HCL 2 MG/2ML IJ SOLN
INTRAMUSCULAR | Status: AC
  Filled 2015-12-12: qty 2

## 2015-12-12 MED ORDER — DEXTROSE 5 % IV SOLN
3.0000 g | INTRAVENOUS | Status: AC
  Administered 2015-12-12: 3 g via INTRAVENOUS

## 2015-12-12 MED ORDER — PROPOFOL 10 MG/ML IV BOLUS
INTRAVENOUS | Status: DC | PRN
  Administered 2015-12-12: 150 mg via INTRAVENOUS

## 2015-12-12 MED ORDER — METHYLENE BLUE 0.5 % INJ SOLN
INTRAVENOUS | Status: DC | PRN
  Administered 2015-12-12: 5 mL via INTRAMUSCULAR

## 2015-12-12 MED ORDER — BUPIVACAINE-EPINEPHRINE (PF) 0.5% -1:200000 IJ SOLN
INTRAMUSCULAR | Status: DC | PRN
  Administered 2015-12-12: 60 mL

## 2015-12-12 SURGICAL SUPPLY — 58 items
APPLIER CLIP 9.375 MED OPEN (MISCELLANEOUS) ×4
BINDER BREAST LRG (GAUZE/BANDAGES/DRESSINGS) IMPLANT
BINDER BREAST MEDIUM (GAUZE/BANDAGES/DRESSINGS) IMPLANT
BINDER BREAST XLRG (GAUZE/BANDAGES/DRESSINGS) IMPLANT
BINDER BREAST XXLRG (GAUZE/BANDAGES/DRESSINGS) ×4 IMPLANT
BLADE SURG 15 STRL LF DISP TIS (BLADE) ×2 IMPLANT
BLADE SURG 15 STRL SS (BLADE) ×2
CANISTER SUC SOCK COL 7IN (MISCELLANEOUS) IMPLANT
CANISTER SUCT 1200ML W/VALVE (MISCELLANEOUS) ×4 IMPLANT
CHLORAPREP W/TINT 26ML (MISCELLANEOUS) ×4 IMPLANT
CLIP APPLIE 9.375 MED OPEN (MISCELLANEOUS) ×2 IMPLANT
COVER BACK TABLE 60X90IN (DRAPES) ×4 IMPLANT
COVER MAYO STAND STRL (DRAPES) ×4 IMPLANT
COVER PROBE W GEL 5X96 (DRAPES) ×4 IMPLANT
DECANTER SPIKE VIAL GLASS SM (MISCELLANEOUS) IMPLANT
DEVICE DUBIN W/COMP PLATE 8390 (MISCELLANEOUS) ×4 IMPLANT
DRAPE LAPAROSCOPIC ABDOMINAL (DRAPES) ×4 IMPLANT
DRAPE LAPAROTOMY T 102X78X121 (DRAPES) ×4 IMPLANT
DRAPE UTILITY XL STRL (DRAPES) ×4 IMPLANT
ELECT COATED BLADE 2.86 ST (ELECTRODE) ×4 IMPLANT
ELECT REM PT RETURN 9FT ADLT (ELECTROSURGICAL) ×4
ELECTRODE REM PT RTRN 9FT ADLT (ELECTROSURGICAL) ×2 IMPLANT
GLOVE BIOGEL PI IND STRL 7.0 (GLOVE) ×4 IMPLANT
GLOVE BIOGEL PI IND STRL 8 (GLOVE) ×2 IMPLANT
GLOVE BIOGEL PI INDICATOR 7.0 (GLOVE) ×4
GLOVE BIOGEL PI INDICATOR 8 (GLOVE) ×2
GLOVE ECLIPSE 7.5 STRL STRAW (GLOVE) ×8 IMPLANT
GLOVE EXAM NITRILE EXT CUFF MD (GLOVE) ×4 IMPLANT
GLOVE SURG SS PI 6.5 STRL IVOR (GLOVE) ×4 IMPLANT
GLOVE SURG SS PI 7.0 STRL IVOR (GLOVE) ×4 IMPLANT
GOWN STRL REUS W/ TWL LRG LVL3 (GOWN DISPOSABLE) ×4 IMPLANT
GOWN STRL REUS W/ TWL XL LVL3 (GOWN DISPOSABLE) ×2 IMPLANT
GOWN STRL REUS W/TWL LRG LVL3 (GOWN DISPOSABLE) ×4
GOWN STRL REUS W/TWL XL LVL3 (GOWN DISPOSABLE) ×2
ILLUMINATOR WAVEGUIDE N/F (MISCELLANEOUS) ×4 IMPLANT
KIT MARKER MARGIN INK (KITS) ×4 IMPLANT
LIQUID BAND (GAUZE/BANDAGES/DRESSINGS) ×4 IMPLANT
NDL SAFETY ECLIPSE 18X1.5 (NEEDLE) IMPLANT
NEEDLE FILTER BLUNT 18X 1/2SAF (NEEDLE) ×2
NEEDLE FILTER BLUNT 18X1 1/2 (NEEDLE) ×2 IMPLANT
NEEDLE HYPO 18GX1.5 BLUNT FILL (NEEDLE) ×4 IMPLANT
NEEDLE HYPO 18GX1.5 SHARP (NEEDLE)
NEEDLE HYPO 25X1 1.5 SAFETY (NEEDLE) ×8 IMPLANT
NS IRRIG 1000ML POUR BTL (IV SOLUTION) IMPLANT
PACK BASIN DAY SURGERY FS (CUSTOM PROCEDURE TRAY) ×4 IMPLANT
PENCIL BUTTON HOLSTER BLD 10FT (ELECTRODE) ×4 IMPLANT
SLEEVE SCD COMPRESS KNEE MED (MISCELLANEOUS) ×4 IMPLANT
SPONGE LAP 4X18 X RAY DECT (DISPOSABLE) ×8 IMPLANT
SUT MON AB 4-0 PC3 18 (SUTURE) ×8 IMPLANT
SUT MON AB 5-0 PS2 18 (SUTURE) ×4 IMPLANT
SUT SILK 4 0 TIES 17X18 (SUTURE) IMPLANT
SUT VICRYL 3-0 CR8 SH (SUTURE) ×8 IMPLANT
SYR CONTROL 10ML LL (SYRINGE) ×8 IMPLANT
TOWEL OR 17X24 6PK STRL BLUE (TOWEL DISPOSABLE) ×8 IMPLANT
TOWEL OR NON WOVEN STRL DISP B (DISPOSABLE) IMPLANT
TUBE CONNECTING 20'X1/4 (TUBING) ×1
TUBE CONNECTING 20X1/4 (TUBING) ×3 IMPLANT
YANKAUER SUCT BULB TIP NO VENT (SUCTIONS) ×4 IMPLANT

## 2015-12-12 NOTE — Discharge Instructions (Signed)
[facility] °Office Phone Number [phone] ° °BREAST BIOPSY/ PARTIAL MASTECTOMY: POST OP INSTRUCTIONS ° °Always review your discharge instruction sheet given to you by the facility where your surgery was performed. ° °IF YOU HAVE DISABILITY OR FAMILY LEAVE FORMS, YOU MUST BRING THEM TO THE OFFICE FOR PROCESSING.  DO NOT GIVE THEM TO YOUR DOCTOR. ° °1. A prescription for pain medication may be given to you upon discharge.  Take your pain medication as prescribed, if needed.  If narcotic pain medicine is not needed, then you may take acetaminophen (Tylenol) or ibuprofen (Advil) as needed. °2. Take your usually prescribed medications unless otherwise directed °3. If you need a refill on your pain medication, please contact your pharmacy.  They will contact our office to request authorization.  Prescriptions will not be filled after 5pm or on week-ends. °4. You should eat very light the first 24 hours after surgery, such as soup, crackers, pudding, etc.  Resume your normal diet the day after surgery. °5. Most patients will experience some swelling and bruising in the breast.  Ice packs and a good support bra will help.  Swelling and bruising can take several days to resolve.  °6. It is common to experience some constipation if taking pain medication after surgery.  Increasing fluid intake and taking a stool softener will usually help or prevent this problem from occurring.  A mild laxative (Milk of Magnesia or Miralax) should be taken according to package directions if there are no bowel movements after 48 hours. °7. Unless discharge instructions indicate otherwise, you may remove your bandages 24-48 hours after surgery, and you may shower at that time.  You may have steri-strips (small skin tapes) in place directly over the incision.  These strips should be left on the skin for 7-10 days.  If your surgeon used skin glue on the incision, you may shower in 24 hours.  The glue will flake off over the  next 2-3 weeks.  Any sutures or staples will be removed at the office during your follow-up visit. °8. ACTIVITIES:  You may resume regular daily activities (gradually increasing) beginning the next day.  Wearing a good support bra or sports bra minimizes pain and swelling.  You may have sexual intercourse when it is comfortable. °a. You may drive when you no longer are taking prescription pain medication, you can comfortably wear a seatbelt, and you can safely maneuver your car and apply brakes. °b. RETURN TO WORK:  ______________________________________________________________________________________ °9. You should see your doctor in the office for a follow-up appointment approximately two weeks after your surgery.  Your doctor’s nurse will typically make your follow-up appointment when she calls you with your pathology report.  Expect your pathology report 2-3 business days after your surgery.  You may call to check if you do not hear from us after three days. °10. OTHER INSTRUCTIONS: _______________________________________________________________________________________________ _____________________________________________________________________________________________________________________________________ °_____________________________________________________________________________________________________________________________________ °_____________________________________________________________________________________________________________________________________ ° °WHEN TO CALL YOUR DOCTOR: °1. Fever over 101.0 °2. Nausea and/or vomiting. °3. Extreme swelling or bruising. °4. Continued bleeding from incision. °5. Increased pain, redness, or drainage from the incision. ° °The clinic staff is available to answer your questions during regular business hours.  Please don’t hesitate to call and ask to speak to one of the nurses for clinical concerns.  If you have a medical emergency, go to the nearest  emergency room or call 911.  A surgeon from [facility] is always on call at the hospital. ° °For further questions, please visit [email or web address]  ° ° ° °  Post Anesthesia Home Care Instructions ° °Activity: °Get plenty of rest for the remainder of the day. A responsible adult should stay with you for 24 hours following the procedure.  °For the next 24 hours, DO NOT: °-Drive a car °-Operate machinery °-Drink alcoholic beverages °-Take any medication unless instructed by your physician °-Make any legal decisions or sign important papers. ° °Meals: °Start with liquid foods such as gelatin or soup. Progress to regular foods as tolerated. Avoid greasy, spicy, heavy foods. If nausea and/or vomiting occur, drink only clear liquids until the nausea and/or vomiting subsides. Call your physician if vomiting continues. ° °Special Instructions/Symptoms: °Your throat may feel dry or sore from the anesthesia or the breathing tube placed in your throat during surgery. If this causes discomfort, gargle with warm salt water. The discomfort should disappear within 24 hours. ° °If you had a scopolamine patch placed behind your ear for the management of post- operative nausea and/or vomiting: ° °1. The medication in the patch is effective for 72 hours, after which it should be removed.  Wrap patch in a tissue and discard in the trash. Wash hands thoroughly with soap and water. °2. You may remove the patch earlier than 72 hours if you experience unpleasant side effects which may include dry mouth, dizziness or visual disturbances. °3. Avoid touching the patch. Wash your hands with soap and water after contact with the patch. °  ° °

## 2015-12-12 NOTE — Interval H&P Note (Signed)
History and Physical Interval Note:  12/12/2015 11:23 AM  Betty Henry  has presented today for surgery, with the diagnosis of RIGHT BREAST CANCER  The various methods of treatment have been discussed with the patient and family. After consideration of risks, benefits and other options for treatment, the patient has consented to  Procedure(s): RIGHT BREAST LUMPECTOMY WITH RADIOACTIVE SEED AND SENTINEL LYMPH NODE BIOPSY (Right) REMOVAL PORT-A-CATH (N/A) as a surgical intervention .  The patient's history has been reviewed, patient examined, no change in status, stable for surgery.  I have reviewed the patient's chart and labs.  Questions were answered to the patient's satisfaction.     Yomayra Tate T

## 2015-12-12 NOTE — Op Note (Signed)
Preoperative Diagnosis: RIGHT BREAST CANCER  Postoprative Diagnosis: RIGHT BREAST CANCER  Procedure: Procedure(s): RIGHT BREAST LUMPECTOMY WITH RADIOACTIVE SEED AND SENTINEL LYMPH NODE BIOPSY REMOVAL PORT-A-CATH   Surgeon: Excell Seltzer T   Assistants: None  Anesthesia:  General LMA anesthesia  Indications: Patient is status post neoadjuvant chemotherapy for a 2 cm triple negative invasive ductal Carcinoma of the upper outer right breast.  Post treatmentMRI shows complete resolution.  We have recommended proceeding with radioactive seed localized lumpectomy and axillary sentinel lymph node biopsy as well as removal of her Port-A-Cath. The procedure and indications and risks have been discussed extensively and detailed elsewhere.    Procedure Detail:  Patient had previously undergone placement of a radioactive seed at the tumor and clip site in the upper outer right breast. Seed placement was confirmed with the neoprobe in the holding area. She underwent injection of 1 mCi of technetium sulfur colloid intradermally around the right nipple in the holding area preoperatively. She was taken to the operating room, placed in the supine position on the operating table, and laryngeal mask general anesthesia induced. She received preoperative IV antibiotics. Under sterile technique  After patient timeout I injected 5 mL of dilute methylene blue subcutaneously beneath the right nipple and massaged this for several minutes.PAS were in place. The entire anterior chest and axilla and upper right arm were widely steely prepped and draped.  Patient timeout was again performed. The neoprobe was used to localize the seed in the upper outer right breast.  It was essentially at the tail of Spence near the axilla and I elected to use a single axillary incision. An incision was made in the high axilla and dissection carried down through the subcutaneous tissue. A moderate skin and subcutaneous flap was then raised  medially over the tumor localized at the neoprobe.Using the neoprobe for guidance a generous, 5-6 cm specimen of breast tissue was excised aroundthe seed  Which took the excision down to the lateral border of the pectoralis. The specimen was removed and inked for margins. Specimen mammogram showed the marking clip and the seed centrally located within the specimen. It was sent for permanent pathology. The axilla was well exposed through the same incision. Using the neoprobe for guidance I incised the clavipectoral fascia and dissected down on to a blue node with elevated counts of about 900. A second node more laterally was found with bright blue dye and markedly elevated counts of about 4000. To further nodes medially with moderately elevated counts in the range of 200 and no blue dye were also excised. There was no gross adenopathy. At this point background counts in the axilla were minimal and I saw no further blue dye. All soft tissue was extensively infiltrated with Marcaine. The deep axillary tissue was closed with interrupted 3-0 Vicryl. The lumpectomy cavity was marked with clips. The breast and subcutaneous tissue were closed with interrupted 3-0 Vicryl. The skin was closed with subcuticular  5-0 Monocryl and Dermabond. The port site in the upper left chest was exposed and the previous incision excised. Dissection was carried down to the subtenon's tissue to the port and the hub which were exposed and the catheter was withdrawn without difficulty. The port was then dissected away from soft tissue  And removed with the 2 sutures intact. The subcutaneous was closed with interrupted 3-0 Vicryl and the skin with subcuticular 5-0 Monocryl and Dermabond. Sponge needle and instrumennts were correct.    Findings: As above  Estimated Blood Loss:  less than 50 mL         Drains: nnone  Blood Given: none          Specimens: #1 right breast lumpectomy     #2 right axillary sentinel lymph nodes X 4         Complications:  * No complications entered in OR log *         Disposition: PACU - hemodynamically stable.         Condition: stable

## 2015-12-12 NOTE — Transfer of Care (Signed)
Immediate Anesthesia Transfer of Care Note  Patient: Betty Henry  Procedure(s) Performed: Procedure(s): RIGHT BREAST LUMPECTOMY WITH RADIOACTIVE SEED AND SENTINEL LYMPH NODE BIOPSY (Right) REMOVAL PORT-A-CATH (Left)  Patient Location: PACU  Anesthesia Type:General  Level of Consciousness: awake and patient cooperative  Airway & Oxygen Therapy: Patient Spontanous Breathing and Patient connected to face mask oxygen  Post-op Assessment: Report given to RN and Post -op Vital signs reviewed and stable  Post vital signs: Reviewed and stable  Last Vitals:  Filed Vitals:   12/12/15 1104 12/12/15 1105  BP:    Pulse: 72 71  Temp:    Resp: 13 13    Last Pain: There were no vitals filed for this visit.    Patients Stated Pain Goal: 0 ([phone] [id number])  Complications: No apparent anesthesia complications

## 2015-12-12 NOTE — Progress Notes (Signed)
Emotional support during breast injections °

## 2015-12-12 NOTE — Anesthesia Procedure Notes (Signed)
Procedure Name: LMA Insertion Date/Time: 12/12/2015 11:42 AM Performed by: Bethanee Redondo D Pre-anesthesia Checklist: Patient identified, Emergency Drugs available, Suction available and Patient being monitored Patient Re-evaluated:Patient Re-evaluated prior to inductionOxygen Delivery Method: Circle System Utilized Preoxygenation: Pre-oxygenation with 100% oxygen Intubation Type: IV induction Ventilation: Mask ventilation without difficulty LMA: LMA inserted LMA Size: 4.0 Number of attempts: 1 Airway Equipment and Method: Bite block Placement Confirmation: positive ETCO2 Tube secured with: Tape Dental Injury: Teeth and Oropharynx as per pre-operative assessment

## 2015-12-12 NOTE — Anesthesia Preprocedure Evaluation (Signed)
Anesthesia Evaluation  Patient identified by MRN, date of birth, ID band Patient awake    Reviewed: Allergy & Precautions, H&P , Patient's Chart, lab work & pertinent test results, reviewed documented beta blocker date and time   Airway Mallampati: II  TM Distance: >3 FB Neck ROM: full    Dental no notable dental hx.    Pulmonary    Pulmonary exam normal breath sounds clear to auscultation       Cardiovascular  Rhythm:regular Rate:Normal     Neuro/Psych    GI/Hepatic GERD  ,  Endo/Other  Morbid obesity  Renal/GU      Musculoskeletal   Abdominal   Peds  Hematology   Anesthesia Other Findings   Reproductive/Obstetrics                             Anesthesia Physical Anesthesia Plan  ASA: III  Anesthesia Plan:    Post-op Pain Management:    Induction: Intravenous  Airway Management Planned: LMA  Additional Equipment:   Intra-op Plan:   Post-operative Plan:   Informed Consent: I have reviewed the patients History and Physical, chart, labs and discussed the procedure including the risks, benefits and alternatives for the proposed anesthesia with the patient or authorized representative who has indicated his/her understanding and acceptance.   Dental Advisory Given and Dental advisory given  Plan Discussed with: CRNA and Surgeon  Anesthesia Plan Comments: (Discussed GA with LMA, possible sore throat, potential need to switch to ETT, N/V, pulmonary aspiration. Questions answered. )        Anesthesia Quick Evaluation

## 2015-12-16 ENCOUNTER — Encounter (HOSPITAL_BASED_OUTPATIENT_CLINIC_OR_DEPARTMENT_OTHER): Payer: Self-pay | Admitting: General Surgery

## 2015-12-19 NOTE — Anesthesia Postprocedure Evaluation (Signed)
Anesthesia Post Note  Patient: Betty Henry  Procedure(s) Performed: Procedure(s) (LRB): RIGHT BREAST LUMPECTOMY WITH RADIOACTIVE SEED AND SENTINEL LYMPH NODE BIOPSY (Right) REMOVAL PORT-A-CATH (Left)  Patient location during evaluation: PACU Anesthesia Type: General Level of consciousness: sedated Pain management: satisfactory to patient Vital Signs Assessment: post-procedure vital signs reviewed and stable Respiratory status: spontaneous breathing Cardiovascular status: stable Anesthetic complications: no    Last Vitals:  Filed Vitals:   12/12/15 1445 12/12/15 1556  BP: 149/91 145/86  Pulse: 95 91  Temp:  36.7 C  Resp: 16 20    Last Pain:  Filed Vitals:   12/16/15 0928  PainSc: [place]

## 2016-04-07 ENCOUNTER — Ambulatory Visit (HOSPITAL_COMMUNITY): Payer: Self-pay

## 2016-04-15 ENCOUNTER — Encounter (HOSPITAL_COMMUNITY): Payer: Self-pay | Admitting: Hematology & Oncology

## 2016-04-15 ENCOUNTER — Encounter (HOSPITAL_COMMUNITY): Payer: BLUE CROSS/BLUE SHIELD | Attending: Hematology & Oncology | Admitting: Hematology & Oncology

## 2016-04-15 ENCOUNTER — Other Ambulatory Visit (HOSPITAL_COMMUNITY): Payer: Self-pay | Admitting: Hematology & Oncology

## 2016-04-15 VITALS — BP 142/78 | HR 74 | Temp 98.1°F | Resp 18 | Ht 61.5 in | Wt 217.8 lb

## 2016-04-15 DIAGNOSIS — G5753 Tarsal tunnel syndrome, bilateral lower limbs: Secondary | ICD-10-CM

## 2016-04-15 DIAGNOSIS — G620 Drug-induced polyneuropathy: Secondary | ICD-10-CM | POA: Diagnosis not present

## 2016-04-15 DIAGNOSIS — Z853 Personal history of malignant neoplasm of breast: Secondary | ICD-10-CM | POA: Diagnosis not present

## 2016-04-15 DIAGNOSIS — G5750 Tarsal tunnel syndrome, unspecified lower limb: Secondary | ICD-10-CM

## 2016-04-15 DIAGNOSIS — C50919 Malignant neoplasm of unspecified site of unspecified female breast: Secondary | ICD-10-CM

## 2016-04-15 DIAGNOSIS — Z9889 Other specified postprocedural states: Secondary | ICD-10-CM

## 2016-04-15 DIAGNOSIS — T451X5A Adverse effect of antineoplastic and immunosuppressive drugs, initial encounter: Secondary | ICD-10-CM

## 2016-04-15 DIAGNOSIS — Z8639 Personal history of other endocrine, nutritional and metabolic disease: Secondary | ICD-10-CM

## 2016-04-15 MED ORDER — ALPRAZOLAM 1 MG PO TABS: 1.0000 mg | ORAL_TABLET | Freq: Every evening | ORAL | 3 refills | Status: DC | PRN

## 2016-04-15 MED ORDER — ESOMEPRAZOLE MAGNESIUM 40 MG PO CPDR: 40.0000 mg | DELAYED_RELEASE_CAPSULE | Freq: Every day | ORAL | 3 refills | Status: DC

## 2016-04-15 NOTE — Patient Instructions (Addendum)
[place] at [facility] Discharge Instructions  RECOMMENDATIONS MADE BY THE CONSULTANT AND ANY TEST RESULTS WILL BE SENT TO YOUR REFERRING PHYSICIAN.  You saw Dr. Whitney Muse today. Nexium was sent to your pharmacy. Xanax is prescription. You are being referred to surviorship clinic. Follow up in 3 months with lab work. Sign release to get records from your PCP. You will be scheduled for 3-D diagnostic mammogram.  Thank you for choosing [place] at [facility] to provide your oncology and hematology care.  To afford each patient quality time with our provider, please arrive at least 15 minutes before your scheduled appointment time.   Beginning January 23rd 2017 lab work for the [facility] will be done in the  Main lab at [facility] on 1st floor. If you have a lab appointment with the [facility] please come in thru the  Main Entrance and check in at the main information desk  You need to re-schedule your appointment should you arrive 10 or more minutes late.  We strive to give you quality time with our providers, and arriving late affects you and other patients whose appointments are after yours.  Also, if you no show three or more times for appointments you may be dismissed from the clinic at the providers discretion.     Again, thank you for choosing [facility].  Our hope is that these requests will decrease the amount of time that you wait before being seen by our physicians.       _____________________________________________________________  Should you have questions after your visit to [facility], please contact our office at [phone] between the hours of 8:30 a.m. and 4:30 p.m.  Voicemails left after 4:30 p.m. will not be returned until the following business day.  For prescription refill requests, have your pharmacy contact our office.         Resources For Cancer Patients and their  Caregivers ? American Cancer Society: Can assist with transportation, wigs, general needs, runs Look Good Feel Better.        380-409-1911 ? Cancer Care: Provides financial assistance, online support groups, medication/co-pay assistance.  1-800-813-HOPE 601-580-8607) ? [place] Assists [place] cancer patients and their families through emotional , educational and financial support.  7177822916 ? [place] DSS Where to apply for food stamps, Medicaid and utility assistance. 443-657-4799 ? [facility]: Transportation to medical appointments. (757)704-3880 ? Social Security Administration: May apply for disability if have a Stage IV cancer. 2268501592 952-830-3241 ? [employer], Disability and Transit Services: Assists with nutrition, care and transit needs. [place] Support Programs: @10RELATIVEDAYS @ > Cancer Support Group  2nd Tuesday of the month 1pm-2pm, Journey Room  > Creative Journey  3rd Tuesday of the month 1130am-1pm, Journey Room  > Look Good Feel Better  1st Wednesday of the month 10am-12 noon, Journey Room (Call [place] to register [phone])

## 2016-04-15 NOTE — Progress Notes (Signed)
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 - DUCTAL CARCINOMA IN SITU. - SEE COMMENT. Microscopic Comment The carcinoma appears grade 3. Per outside report, the tumor cells are negative for estrogen receptor, progesterone receptor and Her 2 neu. Ki-67 is 86%. (JBK:ds 06/17/15) JOSHUA KISH MD      06/20/2015 Imaging    MRI breast Right breast: In the upper-outer quadrant posterior 1/3 depth right breast, there is a 2.1 x 1.3 x 1.4 cm spiculated enhancing mass with washout enhancement kinetics and associated biopsy clip consistent with patient's known cancer.      06/30/2015 - 08/19/2015 Chemotherapy    Dose Dense AC x 4       09/02/2015 - 10/28/2015 Chemotherapy    Dose Dense Taxol X 2, reaction, then changed to abraxane       11/03/2015 Imaging    MRI breast Complete imaging response to neoadjuvant chemotherapy. No mass or abnormal enhancement is seen today.      12/12/2015 Surgery    RIGHT BREAST LUMPECTOMY WITH RADIOACTIVE SEED AND SENTINEL LYMPH NODE BIOPSY REMOVAL PORT-A-CATH, Dr. Excell Seltzer      12/12/2015 Pathology Results    Breast, lumpectomy, Right - FIBROSIS, HEMOSIDERIN DEPOSITION AND FOCAL GIANT CELL REACTION. - NO RESIDUAL TUMOR. - MARGINS NOT INVOLVED. 2. Lymph node, sentinel, biopsy, Right axillary #1 - ONE BENIGN LYMPH NODE (0/1). 3. Lymph node, sentinel, biopsy, Right axillary #2 - ONE BENIGN LYMPH NODE (0/1). 4. Lymph node, sentinel, biopsy, Right axillary #3 - ONE BENIGN LYMPH NODE (0/1). 5. Lymph node, sentinel, biopsy, Right axillary #4 - ONE BENIGN LYMPH NODE (0/1).        HISTORY OF PRESENTING ILLNESS:  Betty Henry 60 y.o. female is here because of referral from Dr. Isidore Moos for right breast cancer, triple negative disease.   Betty Henry is a pleasant 60 year-old female who presented with pain in her left breast. She then had bilateral mammograms. An abnormality was seen in the upper outer quadrant of the right breast. On ultrasound this measured 2 cm. An ultrasound-guided biopsy on 05/23/2015 revealed triple negative invasive ductal carcinoma. Her left breast pain had resolved.  She was seen by Dr. Excell Seltzer who referred her to Dr. Tressie Stalker for consideration of neoadjuvant chemotherapy. She was treated with AC x 4 and Taxol, switched to abraxane after 2 cycles secondary to infusion reaction. Repeat MRI showed resolution of disease.  She underwent right breast lumpectomy and sentinel node biopsy on 12/12/2015 with Dr. Excell Seltzer. There was no residual tumor in the lumpectomy specimen. Four sentinel nodes were all negative.   She began radiation treatment in July 2017 which was completed in August 2017 with a total of 50.4 Gy. She was last seen by Dr. Tressie Stalker on 12/22/2015.The patient is here to establish care and surveillance of right breast cancer.  Patient mentions today that she found out she had breast cancer after she began feeling left sided chest pain and thought it may be her heart. She went to her PCP and was evaluated, but was told her heart was normal. She then considered having a mammogram, since it had been 8 years since her last one. Tumor was actually  on the opposite side that was causing her pain.   Dr. Excell Seltzer did her surgery in [place] and radiation was done in [place]. Dr. Isidore Moos was supposed to be her doctor, but she ended up seeing someone else due to scheduling issues. Betty Henry says radiation went well and she did not experience redness or peeling until the last week.   Betty Henry had a radiation follow-up this past Tuesday (04/13/2016). She says her next  follow up is in either 3 months or 6 months.   Patient has not had a colonoscopy in many years.   She had a hysterectomy in 1986.  Betty Henry reports extreme neuropathy in lower extremities, but says she also has tarsal tunnel syndrome and is not sure if one is exacerbating the other. She asked if the neuropathy would subside over time. Patient is currently prescribed 300 mg gabapentin 3x daily, but only takes one pill because she does not want to feel drowsy at work.   She reports abdominal tenderness, but says she had an ultrasound and everything appeared normal. She says her bowels are normal and denies hematochezia.No change in baseline bowel habits.  She has no other complaints today.    MEDICAL HISTORY:  Past Medical History:  Diagnosis Date  . Achilles tendon pain   . Arthritis    knees  . Breast cancer ([place])   . Cancer ([place])    right breast-already finished chemo  . GERD (gastroesophageal reflux disease)   . Hypothyroidism   . Psoriasis    elbows, knees  . Psoriasis    bil legs, elbows and hands  . TFCC (triangular fibrocartilage complex) tear    left    SURGICAL HISTORY: Past Surgical History:  Procedure Laterality Date  . ABDOMINAL HYSTERECTOMY     partial  . BREAST LUMPECTOMY WITH RADIOACTIVE SEED AND SENTINEL LYMPH NODE BIOPSY Right 12/12/2015   Procedure: RIGHT BREAST LUMPECTOMY WITH RADIOACTIVE SEED AND SENTINEL LYMPH NODE BIOPSY;  Surgeon: Excell Seltzer, MD;  Location: [facility];  Service: General;  Laterality: Right;  . BUNIONECTOMY Left   . CHOLECYSTECTOMY  09/2010  . CHOLECYSTECTOMY    . CYST EXCISION Left    wrist  . FOOT SURGERY  2004   left  . HAND TENDON SURGERY Left 2012  . KNEE ARTHROSCOPY Left    x2  . KNEE SURGERY  2000, 2008   left  . PARTIAL HYSTERECTOMY  1989  . PORT-A-CATH REMOVAL Left 12/12/2015   Procedure: REMOVAL PORT-A-CATH;  Surgeon: Excell Seltzer, MD;  Location: [facility];  Service: General;   Laterality: Left;  . PORTACATH PLACEMENT Left 06/27/2015   Procedure: INSERTION PORT-A-CATH;  Surgeon: Excell Seltzer, MD;  Location: [facility];  Service: General;  Laterality: Left;  . WRIST ARTHROSCOPY  06/29/2011   Procedure: ARTHROSCOPY WRIST;  Surgeon: Tennis Must;  Location: [facility];  Service: Orthopedics;  Laterality: Left;  left wrist tfcc repair    SOCIAL HISTORY: Social History   Social History  . Marital status: Married    Spouse name: N/A  . Number of children: N/A  . Years of education: N/A   Occupational History  . Not on file.   Social History Main Topics  . Smoking status: Never Smoker  . Smokeless tobacco: Never Used  . Alcohol use No  . Drug use: No  . Sexual activity: Yes     Comment: married   Other Topics Concern  . Not on file   Social  History Narrative   ** Merged History Encounter **      Married since Feb 16.  One son, two stepsons, two grandsons. 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 COPD Mother   . Emphysema Mother   . Diabetes Father   . Hypertension Father   . Stroke Father   . Kidney disease Sister     Stage III  . Thyroid disease Sister     Mother and father deceased  Mom passed away at 71 with COPD, smoked for 26 years Father passed 10, had a stroke few years prior to death. 2 sisters; 1 sister has kidney disease other is healthy Grandfather died from stomach cancer   ALLERGIES:  is allergic to paclitaxel; meperidine; ciprofloxacin; codeine; adhesive [tape]; and hydrocodone.  MEDICATIONS:  Current Outpatient Prescriptions  Medication Sig Dispense Refill  . acetaminophen (TYLENOL) 500 MG tablet Take 500 mg by mouth every 6 (six) hours as needed.    . ALPRAZolam (XANAX) 1 MG tablet Take 1 tablet (1 mg total) by mouth at bedtime as needed for anxiety. 30 tablet 3  . esomeprazole (NEXIUM) 40  MG capsule Take 1 capsule (40 mg total) by mouth daily at 12 noon. 30 capsule 3  . gabapentin (NEURONTIN) 300 MG capsule Take 300 mg by mouth 3 (three) times daily.    Marland Kitchen levothyroxine (SYNTHROID, LEVOTHROID) 75 MCG tablet Take 75 mcg by mouth daily before breakfast.    . loperamide (IMODIUM A-D) 2 MG tablet Take 2 mg by mouth as needed for diarrhea or loose stools.    . mometasone (ELOCON) 0.1 % ointment Apply topically daily.    . Travoprost, BAK Free, (TRAVATAN) 0.004 % SOLN ophthalmic solution Place 1 drop into both eyes at bedtime.    . Vitamin D, Ergocalciferol, (DRISDOL) 50000 units CAPS capsule Take 50,000 Units by mouth once a week.  5   No current facility-administered medications for this visit.     Review of Systems  Constitutional: Negative.   HENT: Negative.   Eyes: Negative.   Respiratory: Negative.   Cardiovascular: Negative.   Gastrointestinal: Negative.        Abdominal tenderness  Genitourinary: Negative.   Musculoskeletal: Negative.   Skin: Negative.   Neurological: Positive for tingling.       Tingling in hands and lower extremities  Endo/Heme/Allergies: Negative.   Psychiatric/Behavioral: Negative.   All other systems reviewed and are negative.  14 point ROS was done and is otherwise as detailed above or in HPI   PHYSICAL EXAMINATION: ECOG PERFORMANCE STATUS: 1 - Symptomatic but completely ambulatory  Vitals:   04/15/16 1526  BP: (!) 142/78  Pulse: 74  Resp: 18  Temp: 98.1 F (36.7 C)   Filed Weights   04/15/16 1526  Weight: 217 lb 12.8 oz (98.8 kg)     Physical Exam  Constitutional: She is oriented to person, place, and time and well-developed, well-nourished, and in no distress.  HENT:  Head: Normocephalic and atraumatic.  Nose: Nose normal.  Mouth/Throat: Oropharynx is clear and moist. No oropharyngeal exudate.  Eyes: Conjunctivae and EOM are normal. Pupils are equal, round, and reactive to light. Right eye exhibits no discharge. Left eye  exhibits no discharge. No scleral icterus.  Neck: Normal range of motion. Neck supple. No tracheal deviation present. No thyromegaly present.  Cardiovascular: Normal rate, regular rhythm and normal heart sounds.  Exam reveals no gallop  and no friction rub.   No murmur heard. Pulmonary/Chest: Effort normal and breath sounds normal. She has no wheezes. She has no rales.  Abdominal: Soft. Bowel sounds are normal. She exhibits no distension and no mass. There is no tenderness. There is no rebound and no guarding.  Musculoskeletal: Normal range of motion. She exhibits no edema.  Lymphadenopathy:    She has no cervical adenopathy.  Neurological: She is alert and oriented to person, place, and time. She has normal reflexes. No cranial nerve deficit. Gait normal. Coordination normal.  Skin: Skin is warm and dry. No rash noted.  Discoloration on left breast from radiation  Psychiatric: Mood, memory, affect and judgment normal.  Nursing note and vitals reviewed. Bilateral Breast exam is  Performed and unremarkable except for radiation changes to the L breast. No palpable abnormalities, no nipple changes. No axillary adenopathy    LABORATORY DATA:  I have reviewed the data as listed Lab Results  Component Value Date   HGB 14.0 06/29/2011   CMP  No results found for: NA, K, CL, CO2, GLUCOSE, BUN, CREATININE, CALCIUM, PROT, ALBUMIN, AST, ALT, ALKPHOS, BILITOT, GFRNONAA, GFRAA   RADIOGRAPHIC STUDIES: I have personally reviewed the radiological images as listed and agreed with the findings in the report. No results found.  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 She has completed all recommended therapy.   We discussed her residual neuropathy. I advised her that target dose of neurontin for neuropathy is 2700 mg/day in three divided doses, HOWEVER we did discuss that the only drug with some data showing benefit in chemotherapy induced neuropathy is cymbalta. I have encouraged physical activity as there is evidence showing benefit.  Mammography has been ordered. Breast exam today is benign.   We discussed the importance of screening colonoscopy.  I recommended the patient attend survivorship clinic and meet Gretchen. I will arrange this for her.   NCCN guidelines recommends the following surveillance for invasive breast cancer:  A. History and Physical exam every 4-6 months for 5 years and then every 12 months.  B. Mammography every 12 months  C. Women on Tamoxifen: annual gynecologic assessment every 12 months if uterus is present.  D. Women on aromatase inhibitor or who experience ovarian failure secondary to treatment should have monitoring of bone health with a bone mineral density determination at baseline and periodically thereafter.  E. Assess and encourage adherence to adjuvant endocrine therapy.  F. Evidence suggests that active lifestyle and achieving and maintaining an ideal body weight (20-25 BMI) may lead to optimal breast cancer outcomes.   I suggested the patient continue taking Drisdol. I have refilled her Xanax and Nexium.   Will order CBC with diff and CMP for patient's next visit. .   Follow up with Betty Henry in three months.   ORDERS PLACED FOR THIS ENCOUNTER: Orders Placed This Encounter  Procedures  . MM DIAG BREAST TOMO BILATERAL  . CBC with Differential  . Comprehensive metabolic panel    All questions were answered. The patient knows to call the clinic with any problems, questions or concerns.  This document serves as a record of services personally performed by Ancil Linsey,  MD.  It was created on her behalf by Elmyra Ricks, a trained medical scribe. The creation of this record is based on the scribe's personal observations and the provider's statements to them. This document has been checked and approved by the attending provider.  I have reviewed the above documentation for accuracy and completeness and I agree with the above.  This note was electronically signed.    Molli Hazard, MD  04/15/2016 5:14 PM

## 2016-04-18 DIAGNOSIS — C50919 Malignant neoplasm of unspecified site of unspecified female breast: Secondary | ICD-10-CM | POA: Insufficient documentation

## 2016-05-18 ENCOUNTER — Encounter (HOSPITAL_COMMUNITY): Payer: Self-pay | Admitting: Adult Health

## 2016-05-18 ENCOUNTER — Encounter (HOSPITAL_COMMUNITY): Payer: BLUE CROSS/BLUE SHIELD | Attending: Hematology & Oncology | Admitting: Adult Health

## 2016-05-18 ENCOUNTER — Encounter: Payer: Self-pay | Admitting: *Deleted

## 2016-05-18 DIAGNOSIS — C50411 Malignant neoplasm of upper-outer quadrant of right female breast: Secondary | ICD-10-CM

## 2016-05-18 DIAGNOSIS — L282 Other prurigo: Secondary | ICD-10-CM | POA: Insufficient documentation

## 2016-05-18 DIAGNOSIS — Z171 Estrogen receptor negative status [ER-]: Secondary | ICD-10-CM

## 2016-05-18 DIAGNOSIS — G629 Polyneuropathy, unspecified: Secondary | ICD-10-CM | POA: Diagnosis not present

## 2016-05-18 DIAGNOSIS — C50919 Malignant neoplasm of unspecified site of unspecified female breast: Secondary | ICD-10-CM

## 2016-05-18 MED ORDER — HYDROXYZINE HCL 25 MG PO TABS: 25.0000 mg | ORAL_TABLET | Freq: Four times a day (QID) | ORAL | 0 refills | Status: DC | PRN

## 2016-05-18 NOTE — Progress Notes (Addendum)
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 - DUCTAL CARCINOMA IN SITU. - SEE COMMENT. Microscopic Comment The carcinoma appears grade 3. Per outside report, the tumor cells are negative for estrogen receptor, progesterone receptor and Her 2 neu. Ki-67 is 86%. (JBK:ds 06/17/15) JOSHUA KISH MD      06/20/2015 Imaging    MRI breast Right breast: In the upper-outer quadrant posterior 1/3 depth right breast, there is a 2.1 x 1.3 x 1.4 cm spiculated enhancing mass with washout enhancement kinetics and associated biopsy clip consistent with patient's known cancer.      06/30/2015 - 08/19/2015 Chemotherapy    Dose Dense AC x 4       09/02/2015 - 10/28/2015 Chemotherapy    Dose Dense Taxol X 2, reaction, then changed to abraxane       11/03/2015 Imaging    MRI breast Complete imaging response to neoadjuvant chemotherapy. No mass or abnormal enhancement is seen today.      12/12/2015 Surgery    RIGHT BREAST LUMPECTOMY WITH RADIOACTIVE SEED AND SENTINEL LYMPH NODE BIOPSY REMOVAL PORT-A-CATH, Dr. Excell Seltzer      12/12/2015 Pathology Results    Breast, lumpectomy, Right - FIBROSIS, HEMOSIDERIN DEPOSITION AND FOCAL GIANT CELL REACTION. - NO RESIDUAL TUMOR. - MARGINS NOT INVOLVED. 2. Lymph node, sentinel, biopsy, Right axillary #1 - ONE BENIGN LYMPH NODE (0/1). 3. Lymph node, sentinel, biopsy, Right axillary #2 - ONE BENIGN LYMPH NODE (0/1). 4. Lymph node, sentinel, biopsy, Right axillary #3 - ONE BENIGN LYMPH NODE (0/1). 5. Lymph node, sentinel, biopsy, Right axillary #4 - ONE BENIGN LYMPH NODE (0/1).       INTERVAL HISTORY:  Ms. Federici presents  to the [facility] today for our initial meeting to review her survivorship care plan detailing her treatment course for breast cancer, as well as monitoring long-term side effects of that treatment, education regarding health maintenance, screening, and overall wellness and health promotion.     Overall, Ms. Pennella reports feeling quite well since completing her radiation therapy at [facility] approximately 2 months ago.  From a breast cancer standpoint, she tells me that she feels great. She continues to have some peripheral neuropathy in her feet, and struggles with the grip in her hands at times. She takes gabapentin 300 mg 3 times a day, but we'll skip doses during the day as it makes her too sleepy to work sometimes. Her energy levels are continuing to improve. She tells me her fatigue is getting better since completing radiation. She wishes she could exercise more, but this is difficult given her peripheral neuropathy.  Unrelated to her breast cancer, she has several problems including glaucoma, cataracts, ptosis, and reduced visual acuity. She sees an eye specialist on 06/22/16 for further evaluation. She has a tooth that is in need of repair. She was recently put on antibiotics with amoxicillin in preparation for upcoming surgery. She thinks the amoxicillin may have caused a rash. She has a raised pruritic rash to her arms, hands, chest, and back. She noticed this happened when she started taking the antibiotics. She stopped the antibiotics, but it still has not resolved, and "the itching is  driving me crazy."    She is seen today with Loren Racer, our oncology social worker.  Ms. Cammon is married to her husband of 2 years. She has 1 son who lives in [place] and 2 grandsons, ages 53 and 71. 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 For fun, she loves remodeling her home and spending time with her family.    REVIEW OF  SYSTEMS:  Review of Systems  Constitutional: Positive for fatigue.  HENT:         Toothache  Eyes: Positive for eye problems.       Seeing eye specialist  Respiratory: Negative.   Cardiovascular: Negative.   Gastrointestinal: Negative.   Endocrine: Negative.   Neurological:       Peripheral neuropathy  Hematological: Negative.   Psychiatric/Behavioral: Negative.   Breast: Denies any new nodularity, masses, tenderness, nipple changes, or nipple discharge.    A 14-point review of systems was completed and was negative, except as noted above.   ONCOLOGY TREATMENT TEAM:  1. Surgeon:  Dr. Saddie Benders at [facility] 2. Medical Oncologist: Dr. Tressie Stalker [facility]); now Dr. Whitney Muse  3. Radiation Oncologist: Dr. Isidore Moos at [facility] [place], [place])    PAST MEDICAL/SURGICAL HISTORY:  Past Medical History:  Diagnosis Date  . Achilles tendon pain   . Arthritis    knees  . Breast cancer ([place])   . Cancer ([place])    right breast-already finished chemo  . GERD (gastroesophageal reflux disease)   . Hypothyroidism   . Psoriasis    elbows, knees  . Psoriasis    bil legs, elbows and hands  . TFCC (triangular fibrocartilage complex) tear    left   Past Surgical History:  Procedure Laterality Date  . ABDOMINAL HYSTERECTOMY     partial  . BREAST LUMPECTOMY WITH RADIOACTIVE SEED AND SENTINEL LYMPH NODE BIOPSY Right 12/12/2015   Procedure: RIGHT BREAST LUMPECTOMY WITH RADIOACTIVE SEED AND SENTINEL LYMPH NODE BIOPSY;  Surgeon: Excell Seltzer, MD;  Location: [place];  Service: General;  Laterality: Right;  . BUNIONECTOMY Left   . CHOLECYSTECTOMY  09/2010  . CHOLECYSTECTOMY    . CYST EXCISION Left    wrist  . FOOT SURGERY  2004   left  . HAND TENDON SURGERY Left 2012  . KNEE ARTHROSCOPY Left    x2  . KNEE SURGERY  2000, 2008   left  . PARTIAL HYSTERECTOMY  1989  . PORT-A-CATH REMOVAL Left 12/12/2015   Procedure: REMOVAL  PORT-A-CATH;  Surgeon: Excell Seltzer, MD;  Location: [place];  Service: General;  Laterality: Left;  . PORTACATH PLACEMENT Left 06/27/2015   Procedure: INSERTION PORT-A-CATH;  Surgeon: Excell Seltzer, MD;  Location: [place];  Service: General;  Laterality: Left;  . WRIST ARTHROSCOPY  06/29/2011   Procedure: ARTHROSCOPY WRIST;  Surgeon: Tennis Must;  Location: [place];  Service: Orthopedics;  Laterality: Left;  left wrist tfcc repair     ALLERGIES:  Allergies  Allergen Reactions  . Paclitaxel Anaphylaxis    Due to cremaphor component most likely  . Meperidine Nausea And Vomiting  . Ciprofloxacin Other (See Comments)    Abdominal cramping  . Codeine Nausea And Vomiting    Can take with nausea med  . Adhesive [Tape] Rash  . Hydrocodone Nausea Only    States if she takes it she has to use phenergan  also     CURRENT MEDICATIONS:  Outpatient Encounter Prescriptions as of 05/18/2016  Medication Sig Note  . acetaminophen (TYLENOL) 500 MG tablet Take 500 mg by mouth every 6 (six) hours as needed.   . ALPRAZolam (XANAX) 1 MG tablet Take 1 tablet (1 mg total) by mouth at bedtime as needed for anxiety.   Marland Kitchen esomeprazole (NEXIUM) 40 MG capsule Take 1 capsule (40 mg total) by mouth daily at 12 noon.   . gabapentin (NEURONTIN) 300 MG capsule Take 300 mg by mouth 3 (three) times daily.   . hydrOXYzine (ATARAX/VISTARIL) 25 MG tablet Take 1 tablet (25 mg total) by mouth every 6 (six) hours as needed for itching.   . levothyroxine (SYNTHROID, LEVOTHROID) 75 MCG tablet Take 75 mcg by mouth daily before breakfast.   . loperamide (IMODIUM A-D) 2 MG tablet Take 2 mg by mouth as needed for diarrhea or loose stools.   . mometasone (ELOCON) 0.1 % ointment Apply topically daily.   . Travoprost, BAK Free, (TRAVATAN) 0.004 % SOLN ophthalmic solution Place 1 drop into both eyes at bedtime.   . Vitamin D, Ergocalciferol, (DRISDOL) 50000 units CAPS  capsule Take 50,000 Units by mouth once a week. 04/15/2016: Received from: External Pharmacy Received Sig: TAKE 1 CAPSULE BY MOUTH EVERY WEEK   No facility-administered encounter medications on file as of 05/18/2016.      ONCOLOGIC FAMILY HISTORY:  Family History  Problem Relation Age of Onset  . COPD Mother   . Emphysema Mother   . Diabetes Father   . Hypertension Father   . Stroke Father   . Kidney disease Sister     Stage III  . Thyroid disease Sister      GENETIC COUNSELING/TESTING: Not available for review.  SOCIAL HISTORY:  Bailie Christenbury is married to her husband of 2 years. She has 1 son, who lives in [place] and 2 grandsons, ages 68 and 24. 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 For fun, she loves remodeling her home and spending time with her family.  She denies any current tobacco, alcohol, or illicit drug use.     PHYSICAL EXAMINATION:  General: Well-nourished, well-appearing female in no acute distress.  She is unaccompanied today.   HEENT: Head is normocephalic. Sclerae anicteric.  Lymph: No cervical, supraclavicular, or infraclavicular lymphadenopathy noted on palpation.  Cardiovascular: Regular rate and rhythm. Respiratory: Clear to auscultation bilaterally. Chest expansion symmetric; breathing non-labored.  GI: Abdomen soft and round; non-tender, non-distended. Bowel sounds normoactive.  GU: Deferred.  Neuro: No focal deficits. Steady gait.  Psych: Mood and affect normal and appropriate for situation.  Extremities: No edema. Skin: Warm and dry. Papular erythematous rash to hands, arms, chest, and back.    LABORATORY DATA:  None for this visit.  DIAGNOSTIC IMAGING:  None for this visit.      ASSESSMENT AND PLAN:  Ms.. Anastacio is a pleasant 60 y.o. female with Stage IIA right breast invasive ductal carcinoma, ER-/PR-/HER2-, diagnosed in 05/2015; treated with neoadjuvant chemotherapy at [facility] with dose-dense Adriamycin/Cytoxan x 4, then dose-dense taxane therapy (initially started with Taxol x 2 cycles, but was changed to Abraxane d/t side effects). Completed chemotherapy on 10/28/15.  She went on to have right lumpectomy with SLNB and adjuvant radiation therapy.  She completed radiation in late 02/2016. She presents to the Survivorship Clinic for our initial meeting and routine follow-up post-completion of treatment for  breast cancer.    1. Stage IIA triple negative, right breast cancer:  Ms. Bowler is continuing to recover from definitive treatment for breast cancer. She will follow-up with her medical oncologist, Dr. Whitney Muse, in 07/2016 for continued surveillance with history and physical exam. Explained the importance of waiting approximately 6 months after the completion of radiation therapy before her initial mammogram. Therefore, she will be due for her first post-treatment mammogram on 07/20/16 prior to her follow-up visit with Dr. Whitney Muse. Please see breast cancer surveillance recommendations below.   2. Peripheral neuropathy: Ms. Betke continues to have peripheral neuropathy, particularly in her feet. She takes gabapentin 300 mg 3 times a day which is moderately helpful. She tells me she also has a history of tarsal tunnel, which also contributes to her peripheral neuropathy. Her current medication regimen is adequate in controlling her symptoms. No additional recommendations for additional pharmacologic management at this time.  3. Pruritic rash:   Her initial physical exam appears to be a drug rash. She did recently have amoxicillin which she was taking in preparation for a dental procedure. She has since stopped the amoxicillin. I encouraged her to follow-up with her dermatologist, to see if there may be additional recommendations. In the meantime, I did give her a prescription for Atarax, which she continues when necessary to help with her itching. I e-prescribed  Atarax 25 mg Q6Hprn, #60, to [facility].   4. Physical activity/Healthy eating: Getting adequate physical activity and maintaining a healthy diet as a cancer survivor is important for overall wellness and reduces the risk of cancer recurrence. We discussed the [facility], which is a fitness program that is offered to cancer survivors free of charge.  We also reviewed "The Nutrition Rainbow" handout, as well as the American Cancer Society's booklet with recommendations for reducing cancer risk.   5. Health promotion/Cancer screening:  We reviewed the following routine screening recommendations and they were given to the patient today in writing.    6. Support services/Counseling: Ms. Endres was seen today in conjunction with Loren Racer, LCSW, in an effort to address both the physical and social concerns of our cancer survivors at [facility].  (Please see LCSW note for additional documentation & recommendations).  It is not uncommon for this period of the patient's cancer care trajectory to be one of many emotions and stressors.  I provided support today through active listening, validation of concerns, and expressive supportive counseling.  Ms. Casamento was encouraged to take advantage of our support services programs and support groups to better cope in her new life as a cancer survivor after completing anti-cancer treatment.    Dispo:  -1st post-treatment mammogram rescheduled to 07/20/16 prior to visit with Dr. Whitney Muse.  -Return to [facility] to see Dr. Whitney Muse on 07/23/16. -Return to survivorship clinic as needed; no additional follow-up needed at this time.  -Consider transitioning the patient to long-term survivorship, when clinically appropriate.   A total of 35 minutes was spent in face-to-face care of this patient, with greater than 50% of that time spent in counseling and care coordination.   Mike Craze, NP Survivorship Program [place] [phone]

## 2016-05-18 NOTE — Progress Notes (Signed)
  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    Source of Income: Employment, Medical illustrator.    Insurance: BCBS   Family Problems:   None Identified Concerns caring for family needs: "None Identified"}    Emotional Problems: None Identified  Concerns of Adjustment to Diagnosis/Treatment: None Identified   Current Symptoms of Anxiety: No symptoms identified   Current Symptoms of Depression: No symptoms identified  Safety/Risk Concerns: None identified   Mental Status Exam Orientation: person, place, and time Affect: appropriate Thought: normal      Spiritual/Religious: None identified  Living Situation: with spouse of two years.   Functional Status: Independent                                                                                            Strengths and Barriers To Treatment:   Patient Coping Strengths:   Supportive Relationships, Family, Friends, Church, Spirituality, Hopefulness, Conservator, museum/gallery and Other                                                                                                  Identified Problems/Needs and Barriers to Care:  No identified problems    Counseling and Social Work Interventions and Recommendations:   Data processing manager   Impressions/Plan:  CSW met with pt alongside Mike Craze, Survivorship NP. Catheline shared she was able to cope very well through her cancer treatment and has excellent support from her husband, friends, family and  church. Pt reports she used her spirituality to assist with her coping. Pt has strong coping abilities and worked to maintain a positive outlook during her treatments. Pt shared many activities that she likes to do and is eager to become more active as she continues to regain her strength after treatment. Pt denied concerns as she continues in survivorship. CSW and NP reviewed possible coping concerns that many patients experience. Resources for additional support and assistance were provided and pt agrees to follow up as needed.   Loren Racer, [place] Tuesdays   [phone]

## 2016-05-25 ENCOUNTER — Encounter (HOSPITAL_COMMUNITY): Payer: Self-pay

## 2016-07-08 ENCOUNTER — Other Ambulatory Visit (HOSPITAL_COMMUNITY): Payer: Self-pay | Admitting: Hematology & Oncology

## 2016-07-08 DIAGNOSIS — Z853 Personal history of malignant neoplasm of breast: Secondary | ICD-10-CM

## 2016-07-20 ENCOUNTER — Encounter (HOSPITAL_COMMUNITY): Payer: Self-pay

## 2016-07-23 ENCOUNTER — Ambulatory Visit (HOSPITAL_COMMUNITY): Payer: Self-pay | Admitting: Hematology & Oncology

## 2016-07-23 ENCOUNTER — Other Ambulatory Visit (HOSPITAL_COMMUNITY): Payer: Self-pay

## 2016-08-03 ENCOUNTER — Ambulatory Visit (HOSPITAL_COMMUNITY)
Admission: RE | Admit: 2016-08-03 | Discharge: 2016-08-03 | Disposition: A | Discharge status: Home / Self Care | Payer: BLUE CROSS/BLUE SHIELD | Source: Ambulatory Visit | Attending: Hematology & Oncology | Admitting: Hematology & Oncology

## 2016-08-03 DIAGNOSIS — Z9221 Personal history of antineoplastic chemotherapy: Secondary | ICD-10-CM | POA: Diagnosis not present

## 2016-08-03 DIAGNOSIS — C50919 Malignant neoplasm of unspecified site of unspecified female breast: Secondary | ICD-10-CM

## 2016-08-03 DIAGNOSIS — Z853 Personal history of malignant neoplasm of breast: Secondary | ICD-10-CM | POA: Diagnosis not present

## 2016-08-03 DIAGNOSIS — Z1231 Encounter for screening mammogram for malignant neoplasm of breast: Secondary | ICD-10-CM | POA: Diagnosis present

## 2016-08-03 DIAGNOSIS — Z923 Personal history of irradiation: Secondary | ICD-10-CM | POA: Diagnosis not present

## 2016-08-18 ENCOUNTER — Other Ambulatory Visit (HOSPITAL_COMMUNITY): Payer: Self-pay | Admitting: Hematology & Oncology

## 2016-08-18 DIAGNOSIS — C50919 Malignant neoplasm of unspecified site of unspecified female breast: Secondary | ICD-10-CM

## 2016-08-25 ENCOUNTER — Ambulatory Visit (HOSPITAL_COMMUNITY): Payer: Self-pay | Admitting: Hematology & Oncology

## 2016-08-25 ENCOUNTER — Other Ambulatory Visit (HOSPITAL_COMMUNITY): Payer: Self-pay

## 2016-08-31 ENCOUNTER — Encounter (HOSPITAL_COMMUNITY): Payer: Self-pay

## 2016-08-31 ENCOUNTER — Encounter (HOSPITAL_COMMUNITY): Payer: BLUE CROSS/BLUE SHIELD

## 2016-08-31 ENCOUNTER — Encounter (HOSPITAL_COMMUNITY): Payer: BLUE CROSS/BLUE SHIELD | Attending: Oncology | Admitting: Oncology

## 2016-08-31 VITALS — BP 142/68 | HR 73 | Temp 97.9°F | Resp 18 | Wt 223.7 lb

## 2016-08-31 DIAGNOSIS — C50911 Malignant neoplasm of unspecified site of right female breast: Secondary | ICD-10-CM | POA: Diagnosis not present

## 2016-08-31 DIAGNOSIS — G620 Drug-induced polyneuropathy: Secondary | ICD-10-CM | POA: Diagnosis not present

## 2016-08-31 DIAGNOSIS — C50919 Malignant neoplasm of unspecified site of unspecified female breast: Secondary | ICD-10-CM

## 2016-08-31 DIAGNOSIS — Z853 Personal history of malignant neoplasm of breast: Secondary | ICD-10-CM

## 2016-08-31 LAB — COMPREHENSIVE METABOLIC PANEL
ALT: 32 U/L (ref 14–54)
ANION GAP: 7 (ref 5–15)
AST: 31 U/L (ref 15–41)
Albumin: 3.7 g/dL (ref 3.5–5.0)
Alkaline Phosphatase: 56 U/L (ref 38–126)
BUN: 11 mg/dL (ref 6–20)
CHLORIDE: 109 mmol/L (ref 101–111)
CO2: 24 mmol/L (ref 22–32)
CREATININE: 0.71 mg/dL (ref 0.44–1.00)
Calcium: 9.1 mg/dL (ref 8.9–10.3)
GFR calc Af Amer: >60 mL/min (ref >60)
Glucose, Bld: 105 mg/dL — ABNORMAL HIGH (ref 65–99)
Potassium: 4 mmol/L (ref 3.5–5.1)
Sodium: 140 mmol/L (ref 135–145)
Total Bilirubin: 0.6 mg/dL (ref 0.3–1.2)
Total Protein: 6.9 g/dL (ref 6.5–8.1)

## 2016-08-31 LAB — CBC WITH DIFFERENTIAL/PLATELET
Basophils Absolute: 0 10*3/uL (ref 0.0–0.1)
Basophils Relative: 1 %
EOS ABS: 0.2 10*3/uL (ref 0.0–0.7)
EOS PCT: 3 %
HCT: 38.3 % (ref 36.0–46.0)
Hemoglobin: 13.1 g/dL (ref 12.0–15.0)
LYMPHS PCT: 27 %
Lymphs Abs: 1.3 10*3/uL (ref 0.7–4.0)
MCH: 32.8 pg (ref 26.0–34.0)
MCHC: 34.2 g/dL (ref 30.0–36.0)
MCV: 96 fL (ref 78.0–100.0)
MONO ABS: 0.5 10*3/uL (ref 0.1–1.0)
Monocytes Relative: 10 %
Neutro Abs: 2.9 10*3/uL (ref 1.7–7.7)
Neutrophils Relative %: 59 %
PLATELETS: 322 10*3/uL (ref 150–400)
RBC: 3.99 MIL/uL (ref 3.87–5.11)
RDW: 13.3 % (ref 11.5–15.5)
WBC: 4.9 10*3/uL (ref 4.0–10.5)

## 2016-08-31 NOTE — Patient Instructions (Signed)
[place] at [facility] Discharge Instructions  RECOMMENDATIONS MADE BY THE CONSULTANT AND ANY TEST RESULTS WILL BE SENT TO YOUR REFERRING PHYSICIAN.   Return in 3 months to see Elzie Rings  Copy of labs given today  Thank you for choosing [place] at [facility] to provide your oncology and hematology care.  To afford each patient quality time with our provider, please arrive at least 15 minutes before your scheduled appointment time.    If you have a lab appointment with the [facility] please come in thru the  Main Entrance and check in at the main information desk  You need to re-schedule your appointment should you arrive 10 or more minutes late.  We strive to give you quality time with our providers, and arriving late affects you and other patients whose appointments are after yours.  Also, if you no show three or more times for appointments you may be dismissed from the clinic at the providers discretion.     Again, thank you for choosing [facility].  Our hope is that these requests will decrease the amount of time that you wait before being seen by our physicians.       _____________________________________________________________  Should you have questions after your visit to [facility], please contact our office at [phone] between the hours of 8:30 a.m. and 4:30 p.m.  Voicemails left after 4:30 p.m. will not be returned until the following business day.  For prescription refill requests, have your pharmacy contact our office.       Resources For Cancer Patients and their Caregivers ? American Cancer Society: Can assist with transportation, wigs, general needs, runs Look Good Feel Better.        320-711-1852 ? Cancer Care: Provides financial assistance, online support groups, medication/co-pay assistance.  1-800-813-HOPE 516-180-3107) ? [place] Assists [place]  cancer patients and their families through emotional , educational and financial support.  346-317-8194 ? [place] DSS Where to apply for food stamps, Medicaid and utility assistance. 905-092-8553 ? [facility]: Transportation to medical appointments. 910-833-4924 ? Social Security Administration: May apply for disability if have a Stage IV cancer. 737 174 5416 941-025-0066 ? [employer], Disability and Transit Services: Assists with nutrition, care and transit needs. [place] Support Programs: @10RELATIVEDAYS @ > Cancer Support Group  2nd Tuesday of the month 1pm-2pm, Journey Room  > Creative Journey  3rd Tuesday of the month 1130am-1pm, Journey Room  > Look Good Feel Better  1st Wednesday of the month 10am-12 noon, Journey Room (Call [place] to register [phone])

## 2016-08-31 NOTE — Progress Notes (Signed)
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 - DUCTAL CARCINOMA IN SITU. - SEE COMMENT. Microscopic Comment The carcinoma appears grade 3. Per outside report, the tumor cells are negative for estrogen receptor, progesterone receptor and Her 2 neu. Ki-67 is 86%. (JBK:ds 06/17/15) JOSHUA KISH MD      06/20/2015 Imaging    MRI breast Right breast: In the upper-outer quadrant posterior 1/3 depth right breast, there is a 2.1 x 1.3 x 1.4 cm spiculated enhancing mass with washout enhancement kinetics and associated biopsy clip consistent with patient's known cancer.      06/30/2015 - 08/19/2015 Chemotherapy    Dose Dense AC x 4       09/02/2015 - 10/28/2015 Chemotherapy    Dose Dense Taxol X 2, reaction, then changed to abraxane       11/03/2015 Imaging    MRI breast Complete imaging response to neoadjuvant chemotherapy. No mass or abnormal enhancement is seen today.      12/12/2015 Surgery    RIGHT BREAST LUMPECTOMY WITH RADIOACTIVE SEED AND SENTINEL LYMPH NODE BIOPSY REMOVAL PORT-A-CATH, Dr. Excell Seltzer      12/12/2015 Pathology Results    Breast, lumpectomy, Right - FIBROSIS, HEMOSIDERIN DEPOSITION AND FOCAL GIANT CELL REACTION. - NO RESIDUAL TUMOR. - MARGINS NOT INVOLVED. 2. Lymph node, sentinel, biopsy, Right axillary #1 - ONE BENIGN LYMPH NODE (0/1). 3. Lymph node, sentinel, biopsy, Right axillary #2 - ONE BENIGN LYMPH NODE (0/1). 4. Lymph node, sentinel, biopsy, Right axillary #3 - ONE BENIGN LYMPH NODE (0/1). 5. Lymph node, sentinel, biopsy, Right axillary #4 - ONE BENIGN LYMPH NODE (0/1).        HISTORY OF PRESENTING ILLNESS:  Betty Henry 61 y.o. female is here because of referral from Dr. Isidore Moos for right breast cancer, triple negative disease.   [name] is a pleasant 61 year-old female who presented with pain in her left breast. She then had bilateral mammograms. An abnormality was seen in the upper outer quadrant of the right breast. On ultrasound this measured 2 cm. An ultrasound-guided biopsy on 05/23/2015 revealed triple negative invasive ductal carcinoma. Her left breast pain had resolved.  She was seen by Dr. Excell Seltzer who referred her to Dr. Tressie Stalker for consideration of neoadjuvant chemotherapy. She was treated with AC x 4 and Taxol, switched to abraxane after 2 cycles secondary to infusion reaction. Repeat MRI showed resolution of disease.  She underwent right breast lumpectomy and sentinel node biopsy on 12/12/2015 with Dr. Excell Seltzer. There was no residual tumor in the lumpectomy specimen. Four sentinel nodes were all negative.   She began radiation treatment in July 2017 which was completed in August 2017 with a total of 50.4 Gy. She was last seen by Dr. Tressie Stalker on 12/22/2015.The patient is here to establish care and surveillance of right breast cancer.  [name] presents today for continued follow up. She states she has been doing well. Recently noticed a soreness in her right axilla when she moves her arms. Not felt any masses or changes in breasts otherwise. Complains of weight gain.       MEDICAL HISTORY:  Past Medical History:  Diagnosis Date  . Achilles tendon  pain   . Arthritis    knees  . Breast cancer ([place])   . Cancer ([place])    right breast-already finished chemo  . GERD (gastroesophageal reflux disease)   . Hypothyroidism   . Psoriasis    elbows, knees  . Psoriasis    bil legs, elbows and hands  . TFCC (triangular fibrocartilage complex) tear    left    SURGICAL HISTORY: Past Surgical History:  Procedure Laterality Date  . ABDOMINAL  HYSTERECTOMY     partial  . BREAST LUMPECTOMY WITH RADIOACTIVE SEED AND SENTINEL LYMPH NODE BIOPSY Right 12/12/2015   Procedure: RIGHT BREAST LUMPECTOMY WITH RADIOACTIVE SEED AND SENTINEL LYMPH NODE BIOPSY;  Surgeon: Excell Seltzer, MD;  Location: [facility];  Service: General;  Laterality: Right;  . BUNIONECTOMY Left   . CHOLECYSTECTOMY  09/2010  . CHOLECYSTECTOMY    . CYST EXCISION Left    wrist  . FOOT SURGERY  2004   left  . HAND TENDON SURGERY Left 2012  . KNEE ARTHROSCOPY Left    x2  . KNEE SURGERY  2000, 2008   left  . PARTIAL HYSTERECTOMY  1989  . PORT-A-CATH REMOVAL Left 12/12/2015   Procedure: REMOVAL PORT-A-CATH;  Surgeon: Excell Seltzer, MD;  Location: [facility];  Service: General;  Laterality: Left;  . PORTACATH PLACEMENT Left 06/27/2015   Procedure: INSERTION PORT-A-CATH;  Surgeon: Excell Seltzer, MD;  Location: [facility];  Service: General;  Laterality: Left;  . WRIST ARTHROSCOPY  06/29/2011   Procedure: ARTHROSCOPY WRIST;  Surgeon: Tennis Must;  Location: [facility];  Service: Orthopedics;  Laterality: Left;  left wrist tfcc repair    SOCIAL HISTORY: Social History   Social History  . Marital status: Married    Spouse name: N/A  . Number of children: N/A  . Years of education: N/A   Occupational History  . Not on file.   Social History Main Topics  . Smoking status: Never Smoker  . Smokeless tobacco: Never Used  . Alcohol use No  . Drug use: No  . Sexual activity: Yes     Comment: married   Other Topics Concern  . Not on file   Social History Narrative   ** Merged History Encounter **      Married since Feb 16.  One son, two stepsons, two grandsons. 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 COPD Mother   . Emphysema Mother   . Diabetes Father   . Hypertension Father   . Stroke  Father   . Kidney disease Sister     Stage III  . Thyroid disease Sister     Mother and father deceased  Mom passed away at 14 with COPD, smoked for 13 years Father passed 26, had a stroke few years prior to death. 2 sisters; 1 sister has kidney disease other is healthy Grandfather died from stomach cancer   ALLERGIES:  is allergic to paclitaxel; meperidine; ciprofloxacin; codeine; adhesive [tape]; and hydrocodone.  MEDICATIONS:  Current Outpatient Prescriptions  Medication Sig Dispense Refill  . acetaminophen (TYLENOL) 500 MG tablet Take 500 mg by mouth every 6 (six) hours as needed.    . ALPRAZolam (XANAX) 1 MG tablet Take 1 tablet (1 mg total) by mouth at bedtime as needed for anxiety. 30 tablet 3  . esomeprazole (NEXIUM) 40 MG capsule TAKE 1 CAPSULE BY MOUTH  EVERY DAY AT NOON 30 capsule 3  . levothyroxine (SYNTHROID, LEVOTHROID) 75 MCG tablet Take 75 mcg by mouth daily before breakfast.    . loperamide (IMODIUM A-D) 2 MG tablet Take 2 mg by mouth as needed for diarrhea or loose stools.    . mometasone (ELOCON) 0.1 % ointment Apply topically daily.    . Travoprost, BAK Free, (TRAVATAN) 0.004 % SOLN ophthalmic solution Place 1 drop into both eyes at bedtime.    . Vitamin D, Ergocalciferol, (DRISDOL) 50000 units CAPS capsule Take 50,000 Units by mouth once a week.  5  . gabapentin (NEURONTIN) 300 MG capsule Take 300 mg by mouth 3 (three) times daily.    . hydrOXYzine (ATARAX/VISTARIL) 25 MG tablet Take 1 tablet (25 mg total) by mouth every 6 (six) hours as needed for itching. (Patient not taking: Reported on 08/31/2016) 60 tablet 0   No current facility-administered medications for this visit.     Review of Systems  Constitutional: Negative.        Weight gain.  HENT: Negative.   Eyes: Negative.   Respiratory: Negative.   Cardiovascular: Negative.   Gastrointestinal: Negative.   Genitourinary: Negative.   Musculoskeletal: Negative.        Soreness in right axilla when she  moves her arms.  Skin: Negative.   Endo/Heme/Allergies: Negative.   Psychiatric/Behavioral: Negative.   All other systems reviewed and are negative.  14 point ROS was done and is otherwise as detailed above or in HPI   PHYSICAL EXAMINATION: ECOG PERFORMANCE STATUS: 1 - Symptomatic but completely ambulatory  Vitals:   08/31/16 0852  BP: (!) 142/68  Pulse: 73  Resp: 18  Temp: 97.9 F (36.6 C)   Filed Weights   08/31/16 0852  Weight: 223 lb 11.2 oz (101.5 kg)     Physical Exam  Constitutional: She is oriented to person, place, and time and well-developed, well-nourished, and in no distress.  HENT:  Head: Normocephalic and atraumatic.  Nose: Nose normal.  Mouth/Throat: Oropharynx is clear and moist. No oropharyngeal exudate.  Eyes: Conjunctivae and EOM are normal. Pupils are equal, round, and reactive to light. Right eye exhibits no discharge. Left eye exhibits no discharge. No scleral icterus.  Neck: Normal range of motion. Neck supple. No tracheal deviation present. No thyromegaly present.  Cardiovascular: Normal rate, regular rhythm and normal heart sounds.  Exam reveals no gallop and no friction rub.   No murmur heard. Pulmonary/Chest: Effort normal and breath sounds normal. She has no wheezes. She has no rales.    Bilateral breast exam performed.  Abdominal: Soft. Bowel sounds are normal. She exhibits no distension and no mass. There is no tenderness. There is no rebound and no guarding.  Musculoskeletal: Normal range of motion. She exhibits no edema.  Lymphadenopathy:    She has no cervical adenopathy.  Neurological: She is alert and oriented to person, place, and time. She has normal reflexes. No cranial nerve deficit. Gait normal. Coordination normal.  Skin: Skin is warm and dry. No rash noted.  Psychiatric: Mood, memory, affect and judgment normal.  Nursing note and vitals reviewed.        Bilateral diagnostic mammogram on Aug 03 2016  which was Bi-rads  2.  LABORATORY DATA:  I have reviewed the data as listed Lab Results  Component Value Date   WBC 4.9 08/31/2016   HGB 13.1 08/31/2016   HCT 38.3 08/31/2016   MCV 96.0 08/31/2016   PLT 322 08/31/2016   CMP  Component Value Date/Time   NA 140 08/31/2016 0832   K 4.0 08/31/2016 0832   CL 109 08/31/2016 0832   CO2 24 08/31/2016 0832   GLUCOSE 105 (H) 08/31/2016 0832   BUN 11 08/31/2016 0832   CREATININE 0.71 08/31/2016 0832   CALCIUM 9.1 08/31/2016 0832   PROT 6.9 08/31/2016 0832   ALBUMIN 3.7 08/31/2016 0832   AST 31 08/31/2016 0832   ALT 32 08/31/2016 0832   ALKPHOS 56 08/31/2016 0832   BILITOT 0.6 08/31/2016 0832   GFRNONAA >60 08/31/2016 0832   GFRAA >60 08/31/2016 9892     RADIOGRAPHIC STUDIES: I have personally reviewed the radiological images as listed and agreed with the findings in the report. No results found.  2D DIGITAL DIAGNOSTIC BILATERAL MAMMOGRAM WITH CAD AND ADJUNCT TOMO 08/03/2016  IMPRESSION: Lumpectomy and radiation changes of the right breast. No evidence of malignancy in either breast.   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 She has completed all recommended therapy.   We discussed the importance of screening colonoscopy.  I recommended the patient attend survivorship clinic and meet Gretchen. I will arrange this for her.   NCCN guidelines recommends the following surveillance for invasive breast cancer:  A. History and Physical exam every 4-6 months for 5 years and then every 12 months.  B. Mammography every 12 months  C. Women on Tamoxifen: annual gynecologic assessment every 12 months if uterus is  present.  D. Women on aromatase inhibitor or who experience ovarian failure secondary to treatment should have monitoring of bone health with a bone mineral density determination at baseline and periodically thereafter.  E. Assess and encourage adherence to adjuvant endocrine therapy.  F. Evidence suggests that active lifestyle and achieving and maintaining an ideal body weight (20-25 BMI) may lead to optimal breast cancer outcomes.  Continue surveillance. Clinically NED on breast exam today. Diagnostic b/l mammogram 08/03/16 was birads 2.  Bilateral diagnostic mammogram in 1 year.  RTC in 3 months.  She knows to see me sooner if she feels any masses from palpations or axillary lymphadenopathy.     All questions were answered. The patient knows to call the clinic with any problems, questions or concerns.  This document serves as a record of services personally performed by Twana First, MD. It was created on her behalf by Shirlean Mylar, a trained medical scribe. The creation of this record is based on the scribe's personal observations and the provider's statements to them. This document has been checked and approved by the attending provider.   I have reviewed the above documentation for accuracy and completeness and I agree with the above.  This note was electronically signed.    Mikey [employer]  08/31/2016 9:20 AM

## 2016-10-14 NOTE — Pre-Procedure Instructions (Signed)
Betty Henry  10/14/2016      [facility] - [place], [place] - [address] [place] [address] Phone: [phone] Fax: [phone]    Your procedure is scheduled on Thursday, April 5th   Report to [facility] Admitting at 11:45 AM             (posted surgery time 1:45 pm - 2:45pm)   Call this number if you have problems the morning of surgery:  [phone]   Remember:  Do not eat food or drink liquids after midnight Wednesday.   Take these medicines the morning of surgery with A SIP OF WATER :Xanax, Nexium, Levothyroxine.   Do not wear jewelry, make-up or nail polish.  Do not wear lotions, powders, or perfumes, or deoderant.   Do not shave underarms & legs 48 hours prior to surgery.    Do not bring valuables to the hospital.  [facility] is not responsible for any belongings or valuables.  Contacts, dentures or bridgework may not be worn into surgery.  Leave your suitcase in the car.  After surgery it may be brought to your room. For patients admitted to the hospital, discharge time will be determined by your treatment team.  Patients discharged the day of surgery will not be allowed to drive home.   Please read over the following fact sheets that you were given. Pain Booklet and Surgical Site Infection Prevention

## 2016-10-15 ENCOUNTER — Encounter (HOSPITAL_COMMUNITY)
Admission: RE | Admit: 2016-10-15 | Discharge: 2016-10-15 | Disposition: A | Discharge status: Home / Self Care | Payer: BLUE CROSS/BLUE SHIELD | Source: Ambulatory Visit | Attending: Oculoplastics Ophthalmology | Admitting: Oculoplastics Ophthalmology

## 2016-10-15 ENCOUNTER — Encounter (HOSPITAL_COMMUNITY): Payer: Self-pay

## 2016-10-15 DIAGNOSIS — C50919 Malignant neoplasm of unspecified site of unspecified female breast: Secondary | ICD-10-CM | POA: Insufficient documentation

## 2016-10-15 DIAGNOSIS — Z01810 Encounter for preprocedural cardiovascular examination: Secondary | ICD-10-CM | POA: Insufficient documentation

## 2016-10-15 DIAGNOSIS — Z01812 Encounter for preprocedural laboratory examination: Secondary | ICD-10-CM | POA: Insufficient documentation

## 2016-10-15 HISTORY — DX: Anemia, unspecified: D64.9

## 2016-10-15 HISTORY — DX: Unspecified cataract: H26.9

## 2016-10-15 HISTORY — DX: Unspecified glaucoma: H40.9

## 2016-10-15 HISTORY — DX: Polyneuropathy, unspecified: G62.9

## 2016-10-15 LAB — CBC
HCT: 40 % (ref 36.0–46.0)
HEMOGLOBIN: 13.4 g/dL (ref 12.0–15.0)
MCH: 32.1 pg (ref 26.0–34.0)
MCHC: 33.5 g/dL (ref 30.0–36.0)
MCV: 95.9 fL (ref 78.0–100.0)
Platelets: 284 10*3/uL (ref 150–400)
RBC: 4.17 MIL/uL (ref 3.87–5.11)
RDW: 12.5 % (ref 11.5–15.5)
WBC: 5.5 10*3/uL (ref 4.0–10.5)

## 2016-10-15 LAB — BASIC METABOLIC PANEL
Anion gap: 9 (ref 5–15)
BUN: 10 mg/dL (ref 6–20)
CALCIUM: 9.4 mg/dL (ref 8.9–10.3)
CO2: 24 mmol/L (ref 22–32)
CREATININE: 0.79 mg/dL (ref 0.44–1.00)
Chloride: 107 mmol/L (ref 101–111)
GFR calc non Af Amer: >60 mL/min (ref >60)
Glucose, Bld: 101 mg/dL — ABNORMAL HIGH (ref 65–99)
Potassium: 3.8 mmol/L (ref 3.5–5.1)
Sodium: 140 mmol/L (ref 135–145)

## 2016-10-15 NOTE — Progress Notes (Signed)
   10/15/16 0813  OBSTRUCTIVE SLEEP APNEA  Score 5 or greater  Results sent to PCP

## 2016-10-15 NOTE — Progress Notes (Signed)
PCP is Dr. Lonia Mad in [place], [place] 08/2016 Patient had right breast lumpectomy --  Had echo and ekg done 2017 prior to chemo & radiation. Chemo went from 06/2015 - 11/2015  Radiation went from June-Oct 2017. Has been to [facility] in [place], [place]  08/2016 Denies any heart issues, nor has any complaints.  Hasn't seen a cardio.

## 2016-10-20 NOTE — H&P (Addendum)
Subjective:    Betty Henry is a 61 y.o. female who presents for evaluation of bilateral myogenic ptosis. The pain is described as 0/10. Onset was several months ago. Symptoms have been unchanged since.   Review of Systems Pertinent items are noted in HPI.    Objective:   There were no vitals taken for this visit.  General:  alert, cooperative and appears stated age Skin:  normal Eyes: positive findings: eyelids/periorbital: ptosis bilaterally Mouth: MMM no lesions Lymph Nodes:  Cervical, supraclavicular, and axillary nodes normal. Lungs:  clear to auscultation bilaterally Heart:  regular rate and rhythm, S1, S2 normal, no murmur, click, rub or gallop Abdomen: soft, non-tender; bowel sounds normal; no masses,  no organomegaly CVA:  absent Genitourinary: defer exam Extremities:  extremities normal, atraumatic, no cyanosis or edema Neurologic:  negative and Alert and oriented x3. Gait normal. Reflexes and motor strength normal and symmetric. Cranial nerves 2-12 and sensation grossly intact. Psychiatric:  normal mood, behavior, speech, dress, and thought processes    Assessment:  Bilateral Myogenic Ptosis   Plan: Bilateral Internal Ptosis Repair  1. Discussed the risk of surgery,  and the risks of general anesthetic including MI, CVA, sudden death or even reaction to anesthetic medications. The patient understands the risks, any and all questions were answered to the patient's satisfaction. 2. Follow up: 1 week.  Date of Surgery Update (Reviewed with no changes 10/21/16.)

## 2016-10-21 ENCOUNTER — Encounter (HOSPITAL_COMMUNITY)
Admission: RE | Disposition: A | Discharge status: Home / Self Care | Payer: Self-pay | Source: Ambulatory Visit | Attending: Oculoplastics Ophthalmology

## 2016-10-21 ENCOUNTER — Encounter (HOSPITAL_COMMUNITY): Payer: Self-pay | Admitting: *Deleted

## 2016-10-21 ENCOUNTER — Ambulatory Visit (HOSPITAL_COMMUNITY): Payer: BLUE CROSS/BLUE SHIELD | Admitting: Certified Registered Nurse Anesthetist

## 2016-10-21 ENCOUNTER — Ambulatory Visit (HOSPITAL_COMMUNITY)
Admission: RE | Admit: 2016-10-21 | Discharge: 2016-10-21 | Disposition: A | Discharge status: Home / Self Care | Payer: BLUE CROSS/BLUE SHIELD | Source: Ambulatory Visit | Attending: Oculoplastics Ophthalmology | Admitting: Oculoplastics Ophthalmology

## 2016-10-21 DIAGNOSIS — K219 Gastro-esophageal reflux disease without esophagitis: Secondary | ICD-10-CM | POA: Diagnosis not present

## 2016-10-21 DIAGNOSIS — Z9221 Personal history of antineoplastic chemotherapy: Secondary | ICD-10-CM | POA: Insufficient documentation

## 2016-10-21 DIAGNOSIS — Z6841 Body Mass Index (BMI) 40.0 and over, adult: Secondary | ICD-10-CM | POA: Diagnosis not present

## 2016-10-21 DIAGNOSIS — H02423 Myogenic ptosis of bilateral eyelids: Secondary | ICD-10-CM | POA: Diagnosis not present

## 2016-10-21 HISTORY — PX: PTOSIS REPAIR: SHX6568

## 2016-10-21 SURGERY — REPAIR, BLEPHAROPTOSIS
Anesthesia: General | Site: Eye | Laterality: Bilateral

## 2016-10-21 MED ORDER — FENTANYL CITRATE (PF) 250 MCG/5ML IJ SOLN
INTRAMUSCULAR | Status: AC
  Filled 2016-10-21: qty 5

## 2016-10-21 MED ORDER — MIDAZOLAM HCL 2 MG/2ML IJ SOLN
INTRAMUSCULAR | Status: AC
  Filled 2016-10-21: qty 2

## 2016-10-21 MED ORDER — LIDOCAINE-EPINEPHRINE 1 %-1:100000 IJ SOLN
INTRAMUSCULAR | Status: DC | PRN
  Administered 2016-10-21: 1 mL

## 2016-10-21 MED ORDER — PROMETHAZINE HCL 12.5 MG PO TABS: 12.5000 mg | ORAL_TABLET | Freq: Four times a day (QID) | ORAL | 0 refills | Status: DC | PRN

## 2016-10-21 MED ORDER — TETRACAINE HCL 0.5 % OP SOLN
OPHTHALMIC | Status: DC | PRN
  Administered 2016-10-21: 1 [drp] via OPHTHALMIC

## 2016-10-21 MED ORDER — TETRACAINE HCL 0.5 % OP SOLN
OPHTHALMIC | Status: AC
  Filled 2016-10-21: qty 2

## 2016-10-21 MED ORDER — ACETAMINOPHEN-CODEINE #4 300-60 MG PO TABS: 1.0000 | ORAL_TABLET | ORAL | 0 refills | Status: DC | PRN

## 2016-10-21 MED ORDER — BUPIVACAINE HCL (PF) 0.5 % IJ SOLN
INTRAMUSCULAR | Status: AC
  Filled 2016-10-21: qty 30

## 2016-10-21 MED ORDER — PROMETHAZINE HCL 25 MG/ML IJ SOLN
6.2500 mg | INTRAMUSCULAR | Status: DC | PRN
  Administered 2016-10-21: 6.25 mg via INTRAVENOUS

## 2016-10-21 MED ORDER — TOBRAMYCIN-DEXAMETHASONE 0.3-0.1 % OP OINT
TOPICAL_OINTMENT | OPHTHALMIC | Status: AC
  Filled 2016-10-21: qty 3.5

## 2016-10-21 MED ORDER — ERYTHROMYCIN 5 MG/GM OP OINT
TOPICAL_OINTMENT | OPHTHALMIC | Status: DC | PRN
  Administered 2016-10-21: 1 via OPHTHALMIC

## 2016-10-21 MED ORDER — LIDOCAINE-EPINEPHRINE 2 %-1:100000 IJ SOLN
INTRAMUSCULAR | Status: AC
  Filled 2016-10-21: qty 1

## 2016-10-21 MED ORDER — MIDAZOLAM HCL 5 MG/5ML IJ SOLN
INTRAMUSCULAR | Status: DC | PRN
  Administered 2016-10-21: 2 mg via INTRAVENOUS

## 2016-10-21 MED ORDER — BSS IO SOLN
INTRAOCULAR | Status: AC
  Filled 2016-10-21: qty 15

## 2016-10-21 MED ORDER — SODIUM CHLORIDE 0.9 % IV SOLN
INTRAVENOUS | Status: DC
  Administered 2016-10-21: 12:00:00 via INTRAVENOUS

## 2016-10-21 MED ORDER — FENTANYL CITRATE (PF) 100 MCG/2ML IJ SOLN
INTRAMUSCULAR | Status: DC | PRN
  Administered 2016-10-21 (×4): 25 ug via INTRAVENOUS

## 2016-10-21 MED ORDER — PROPOFOL 10 MG/ML IV BOLUS
INTRAVENOUS | Status: DC | PRN
  Administered 2016-10-21: 40 mg via INTRAVENOUS
  Administered 2016-10-21: 10 mg via INTRAVENOUS
  Administered 2016-10-21: 20 mg via INTRAVENOUS
  Administered 2016-10-21 (×2): 10 mg via INTRAVENOUS
  Administered 2016-10-21: 40 mg via INTRAVENOUS
  Administered 2016-10-21: 10 mg via INTRAVENOUS

## 2016-10-21 MED ORDER — EPINEPHRINE PF 1 MG/ML IJ SOLN
INTRAMUSCULAR | Status: AC
  Filled 2016-10-21: qty 1

## 2016-10-21 MED ORDER — 0.9 % SODIUM CHLORIDE (POUR BTL) OPTIME
TOPICAL | Status: DC | PRN
  Administered 2016-10-21: 1000 mL

## 2016-10-21 MED ORDER — PROMETHAZINE HCL 25 MG/ML IJ SOLN
INTRAMUSCULAR | Status: AC
  Filled 2016-10-21: qty 1

## 2016-10-21 MED ORDER — STERILE WATER FOR IRRIGATION IR SOLN
Status: DC | PRN
  Administered 2016-10-21: 1000 mL

## 2016-10-21 MED ORDER — PROPARACAINE HCL 0.5 % OP SOLN
1.0000 [drp] | OPHTHALMIC | Status: DC | PRN
  Administered 2016-10-21 (×2): 1 [drp] via OPHTHALMIC
  Filled 2016-10-21: qty 15

## 2016-10-21 MED ORDER — LACTATED RINGERS IV SOLN
INTRAVENOUS | Status: DC | PRN
  Administered 2016-10-21: 12:00:00 via INTRAVENOUS

## 2016-10-21 MED ORDER — PROPOFOL 10 MG/ML IV BOLUS
INTRAVENOUS | Status: AC
  Filled 2016-10-21: qty 20

## 2016-10-21 MED ORDER — PHENYLEPHRINE HCL 2.5 % OP SOLN
OPHTHALMIC | Status: AC
  Filled 2016-10-21: qty 2

## 2016-10-21 MED ORDER — LIDOCAINE HCL (PF) 1 % IJ SOLN
INTRAMUSCULAR | Status: AC
  Filled 2016-10-21: qty 30

## 2016-10-21 SURGICAL SUPPLY — 17 items
APPLICATOR COTTON TIP 6IN STRL (MISCELLANEOUS) ×3 IMPLANT
APPLICATOR DR MATTHEWS STRL (MISCELLANEOUS) ×3 IMPLANT
CLOSURE WOUND 1/2 X4 (GAUZE/BANDAGES/DRESSINGS) ×1
DRAPE ORTHO SPLIT 87X125 STRL (DRAPES) ×3 IMPLANT
DRAPE SURG 17X23 STRL (DRAPES) ×6 IMPLANT
GLOVE BIO SURGEON STRL SZ7.5 (GLOVE) ×6 IMPLANT
GOWN STRL REUS W/ TWL LRG LVL3 (GOWN DISPOSABLE) ×2 IMPLANT
GOWN STRL REUS W/TWL LRG LVL3 (GOWN DISPOSABLE) ×4
NEEDLE PRECISIONGLIDE 27X1.5 (NEEDLE) IMPLANT
NS IRRIG 1000ML POUR BTL (IV SOLUTION) ×3 IMPLANT
PACK CATARACT CUSTOM (CUSTOM PROCEDURE TRAY) ×3 IMPLANT
PAD ARMBOARD 7.5X6 YLW CONV (MISCELLANEOUS) ×6 IMPLANT
STRIP CLOSURE SKIN 1/2X4 (GAUZE/BANDAGES/DRESSINGS) ×2 IMPLANT
SUT PLAIN 5 0 P 3 18 (SUTURE) ×6 IMPLANT
SUT SILK 4 0 P 3 (SUTURE) ×6 IMPLANT
TOWEL OR 17X24 6PK STRL BLUE (TOWEL DISPOSABLE) ×6 IMPLANT
WATER STERILE IRR 1000ML POUR (IV SOLUTION) ×3 IMPLANT

## 2016-10-21 NOTE — Brief Op Note (Signed)
10/21/2016  1:34 PM  PATIENT:  Barrie Dunker  61 y.o. female  PRE-OPERATIVE DIAGNOSIS:  MYOGENIC PTOSIS OF BOTH EYES  POST-OPERATIVE DIAGNOSIS:  Same  PROCEDURE:  Procedure(s): INTERNAL PTOSIS REPAIR (Bilateral)  SURGEON:  Surgeon(s) and Role:    * Clista Bernhardt, MD - Primary  PHYSICIAN ASSISTANT:   ASSISTANTS: none   ANESTHESIA:   local and MAC  EBL:  No intake/output data recorded.  BLOOD ADMINISTERED:none  DRAINS: none   LOCAL MEDICATIONS USED:  BUPIVICAINE  , LIDOCAINE , Amount: 3 ml, NONE and OTHER EPINEPHRINE  SPECIMEN:  No Specimen  DISPOSITION OF SPECIMEN:  N/A  COUNTS:  YES  TOURNIQUET:  * No tourniquets in log *  DICTATION: .Note written in EPIC  PLAN OF CARE: Discharge to home after PACU  PATIENT DISPOSITION:  PACU - hemodynamically stable.   Delay start of Pharmacological VTE agent (>24hrs) due to surgical blood loss or risk of bleeding: yes

## 2016-10-21 NOTE — Transfer of Care (Signed)
Immediate Anesthesia Transfer of Care Note  Patient: Betty Henry  Procedure(s) Performed: Procedure(s): INTERNAL PTOSIS REPAIR (Bilateral)  Patient Location: PACU  Anesthesia Type:MAC  Level of Consciousness: awake, alert  and oriented  Airway & Oxygen Therapy: Patient Spontanous Breathing and Patient connected to nasal cannula oxygen  Post-op Assessment: Report given to RN, Post -op Vital signs reviewed and stable and Patient moving all extremities X 4  Post vital signs: Reviewed and stable  Last Vitals:  Vitals:   10/21/16 1054 10/21/16 1430  BP: (!) 161/87   Pulse: 72   Resp: 18   Temp: 36.5 C (P) 36.6 C    Last Pain:  Vitals:   10/21/16 1054  TempSrc: Oral      Patients Stated Pain Goal: 5 (09/32/67 1245)  Complications: No apparent anesthesia complications

## 2016-10-21 NOTE — Anesthesia Procedure Notes (Signed)
Procedure Name: MAC Date/Time: 10/21/2016 1:45 PM Performed by: Garrison Columbus T Pre-anesthesia Checklist: Patient identified, Emergency Drugs available, Suction available and Patient being monitored Patient Re-evaluated:Patient Re-evaluated prior to inductionOxygen Delivery Method: Nasal cannula Preoxygenation: Pre-oxygenation with 100% oxygen Intubation Type: IV induction Placement Confirmation: positive ETCO2 and breath sounds checked- equal and bilateral Dental Injury: Teeth and Oropharynx as per pre-operative assessment

## 2016-10-21 NOTE — Anesthesia Postprocedure Evaluation (Signed)
Anesthesia Post Note  Patient: Betty Henry  Procedure(s) Performed: Procedure(s) (LRB): INTERNAL PTOSIS REPAIR (Bilateral)  Patient location during evaluation: PACU Anesthesia Type: General Level of consciousness: awake and alert Pain management: pain level controlled Vital Signs Assessment: post-procedure vital signs reviewed and stable Respiratory status: spontaneous breathing, nonlabored ventilation, respiratory function stable and patient connected to nasal cannula oxygen Cardiovascular status: blood pressure returned to baseline and stable Postop Assessment: no signs of nausea or vomiting Anesthetic complications: no       Last Vitals:  Vitals:   10/21/16 1500 10/21/16 1515  BP: 138/80 (!) 147/81  Pulse: 77 74  Resp: 14 13  Temp:  36.7 C    Last Pain:  Vitals:   10/21/16 1515  TempSrc:   PainSc: 3                  Catalina Gravel

## 2016-10-21 NOTE — Anesthesia Preprocedure Evaluation (Addendum)
Anesthesia Evaluation  Patient identified by MRN, date of birth, ID band Patient awake    Reviewed: Allergy & Precautions, NPO status , Patient's Chart, lab work & pertinent test results  Airway Mallampati: II  TM Distance: >3 FB Neck ROM: Full    Dental  (+) Teeth Intact, Dental Advisory Given   Pulmonary neg pulmonary ROS,    Pulmonary exam normal breath sounds clear to auscultation       Cardiovascular Exercise Tolerance: Good negative cardio ROS Normal cardiovascular exam Rhythm:Regular Rate:Normal     Neuro/Psych Chemo induced neuropathy  negative psych ROS   GI/Hepatic Neg liver ROS, GERD  Medicated and Controlled,  Endo/Other  Hypothyroidism Morbid obesity  Renal/GU negative Renal ROS     Musculoskeletal  (+) Arthritis ,   Abdominal   Peds  Hematology  (+) Blood dyscrasia, anemia ,   Anesthesia Other Findings Day of surgery medications reviewed with the patient.  Breast cancer  Reproductive/Obstetrics                            Anesthesia Physical Anesthesia Plan  ASA: III  Anesthesia Plan: General   Post-op Pain Management:    Induction: Intravenous  Airway Management Planned: Oral ETT  Additional Equipment:   Intra-op Plan:   Post-operative Plan: Extubation in OR  Informed Consent: I have reviewed the patients History and Physical, chart, labs and discussed the procedure including the risks, benefits and alternatives for the proposed anesthesia with the patient or authorized representative who has indicated his/her understanding and acceptance.   Dental advisory given  Plan Discussed with: CRNA  Anesthesia Plan Comments: (Risks/benefits of general anesthesia discussed with patient including risk of damage to teeth, lips, gum, and tongue, nausea/vomiting, allergic reactions to medications, and the possibility of heart attack, stroke and death.  All patient  questions answered.  Patient wishes to proceed.)       Anesthesia Quick Evaluation

## 2016-10-21 NOTE — Op Note (Signed)
Procedure(s): INTERNAL PTOSIS REPAIR Procedure Note  Betty Henry female 61 y.o. 10/21/2016  Procedure(s) and Anesthesia Type:    * INTERNAL PTOSIS REPAIR - General  Surgeon(s) and Role:    * Clista Bernhardt, MD - Primary   Indications: The patient was admitted to the hospital with a brief history of bilateral myogenic ptosis. A eye exam revealed findings of bilateral myogenic ptosis. The patient now presents for bilateral internal ptosis repair after discussing therapeutic alternatives.        Surgeon: Clista Bernhardt   Assistants: none  Anesthesia: Monitored Local Anesthesia with Sedation  ASA Class: 2    Procedure Detail  INTERNAL PTOSIS REPAIR  Findings: Procedure Detail  INTERNAL PTOSIS REPAIR OF BILATERAL EYES  Patient is aware that they has lid drooping that is affecting her superior visual field and negatively impacting their quality of life. Patient is aware of the risks and the benefits of the procedure including bruising infection, bleeding, asymmetry and need for re-operation.   The patient was transported to the operating room in supine position. The patient was prepped and draped in the standard sterile fashion for oculoplastic surgery. Attention was turned to the right eyelid. Local anesthesia was administered. The eyelid was everted with a putterman clamp. The 3.34mm was measured from the limbus with a caliper. Then a 4-0 silk was used to mark this area in an interrupted fashion. Then a putterman clamp was placed to correctly isolate conjunctiva and muellers muscle. Then a 5-0 plain gut suture was placed in a running fashion laterally to medially and then medially to laterally then externalized through the skin. This was tied down. Then the conjunctiva and muellers muscle was excised with Wescotts scissors.   Attention was turned to the contralateral eye where the same procedure was performed  Ointment was placed in the eye.   The patient tolerated the  procedure well and was transported to the PACU in stable condition.    Estimated Blood Loss:  Minimal         Drains: none         Total IV Fluids: 136ml  Blood Given: none          Specimens: none         Implants: none        Complications:  * No complications entered in OR log *         Disposition: PACU - hemodynamically stable.         Condition: stable

## 2016-10-22 ENCOUNTER — Encounter (HOSPITAL_COMMUNITY): Payer: Self-pay | Admitting: Oculoplastics Ophthalmology

## 2016-11-30 ENCOUNTER — Ambulatory Visit (HOSPITAL_COMMUNITY): Payer: Self-pay | Admitting: Adult Health

## 2016-12-02 ENCOUNTER — Encounter (HOSPITAL_COMMUNITY): Payer: BLUE CROSS/BLUE SHIELD | Attending: Adult Health | Admitting: Adult Health

## 2016-12-02 ENCOUNTER — Encounter (HOSPITAL_COMMUNITY): Payer: Self-pay | Admitting: Adult Health

## 2016-12-02 ENCOUNTER — Telehealth (HOSPITAL_COMMUNITY): Payer: Self-pay | Admitting: *Deleted

## 2016-12-02 VITALS — BP 126/59 | HR 74 | Temp 98.2°F | Resp 16 | Ht 61.0 in | Wt 229.0 lb

## 2016-12-02 DIAGNOSIS — R51 Headache: Secondary | ICD-10-CM | POA: Diagnosis not present

## 2016-12-02 DIAGNOSIS — R110 Nausea: Secondary | ICD-10-CM

## 2016-12-02 DIAGNOSIS — C50919 Malignant neoplasm of unspecified site of unspecified female breast: Secondary | ICD-10-CM

## 2016-12-02 DIAGNOSIS — Z853 Personal history of malignant neoplasm of breast: Secondary | ICD-10-CM | POA: Diagnosis not present

## 2016-12-02 DIAGNOSIS — Z171 Estrogen receptor negative status [ER-]: Secondary | ICD-10-CM

## 2016-12-02 NOTE — Progress Notes (Signed)
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 - DUCTAL CARCINOMA IN SITU. - SEE COMMENT. Microscopic Comment The carcinoma appears grade 3. Per outside report, the tumor cells are negative for estrogen receptor, progesterone receptor and Her 2 neu. Ki-67 is 86%. (JBK:ds 06/17/15) JOSHUA KISH MD      06/20/2015 Imaging    MRI breast Right breast: In the upper-outer quadrant posterior 1/3 depth right breast, there is a 2.1 x 1.3 x 1.4 cm spiculated enhancing mass with washout enhancement kinetics and associated biopsy clip consistent with patient's known cancer.      06/30/2015 - 08/19/2015 Chemotherapy    Dose Dense AC x 4       09/02/2015 - 10/28/2015 Chemotherapy    Dose Dense Taxol X 2, reaction, then changed to abraxane       11/03/2015 Imaging    MRI breast Complete imaging response to neoadjuvant chemotherapy. No mass or abnormal enhancement is seen today.      12/12/2015 Surgery    RIGHT BREAST LUMPECTOMY WITH RADIOACTIVE SEED AND SENTINEL LYMPH NODE BIOPSY REMOVAL PORT-A-CATH, Dr. Excell Seltzer      12/12/2015 Pathology Results    Breast, lumpectomy, Right - FIBROSIS, HEMOSIDERIN DEPOSITION AND FOCAL GIANT CELL REACTION. - NO RESIDUAL TUMOR. - MARGINS NOT INVOLVED. 2. Lymph node, sentinel, biopsy, Right axillary #1 - ONE BENIGN LYMPH NODE (0/1). 3. Lymph node, sentinel, biopsy, Right axillary #2 - ONE BENIGN LYMPH NODE (0/1). 4. Lymph node, sentinel, biopsy, Right axillary #3 - ONE BENIGN LYMPH NODE (0/1). 5. Lymph node,  sentinel, biopsy, Right axillary #4 - ONE BENIGN LYMPH NODE (0/1).      08/03/2016 Mammogram    IMPRESSION: Lumpectomy and radiation changes of the right breast. No evidence of malignancy in either breast.        INTERVAL HISTORY:  Betty Henry 60 y.o. female returns for follow-up for history of triple negative right breast cancer.   She tells that she does not feel well today. Endorses nausea with vomiting x 1 episode last evening. States that she and her husband ate the same meal, so she does not think it was food-related.  Endorses 1 episode of chills during this time, but no fever.  She feels "swimmy headed" and dizzy as well.  States that she also "woke up with a headache" about 2 days ago.  She tells me that she has had intermittent nausea for years, that is usually self-limiting within a few seconds; "it has been a long time since I threw up."  She states that she has had dizziness in the past as well and "they did a scan of my brain and everything was fine."    Endorses right breast pain, which has been intermittent since she completed radiation therapy. Reports that her breast is swollen and is often uncomfortable at night.  She recently saw the radiation oncologist in [place] and she tells me she was told she is continuing to heal and doing well from their standpoint.   Otherwise, she is largely without other complaints today.  REVIEW OF SYSTEMS:  Review of Systems  Constitutional: Positive for chills (1 episode of chills ) and fatigue (only in past 24 hours ). Negative for fever.  HENT:  Negative.   Eyes: Negative.   Respiratory: Negative.  Negative for cough and shortness of breath.   Cardiovascular: Positive for leg swelling.  Gastrointestinal: Positive for nausea and vomiting. Negative for abdominal pain, blood in stool, constipation and diarrhea.  Endocrine: Negative.   Genitourinary: Negative.  Negative for dysuria, hematuria and vaginal bleeding.   Musculoskeletal:  Negative.   Skin:       Was recently bit by tick on her leg   Neurological: Positive for dizziness and headaches.  Hematological: Negative.   Psychiatric/Behavioral: Negative.      PAST MEDICAL/SURGICAL HISTORY:  Past Medical History:  Diagnosis Date  . Achilles tendon pain   . Anemia   . Arthritis    knees  . Breast cancer ([place])   . Cancer ([place])    right breast-already finished chemo  . Cataracts, bilateral   . GERD (gastroesophageal reflux disease)   . Glaucoma   . Hypothyroidism   . Neuropathy    FROM CHEMO     . Psoriasis    elbows, knees  . Psoriasis    bil legs, elbows and hands  . TFCC (triangular fibrocartilage complex) tear    left   Past Surgical History:  Procedure Laterality Date  . ABDOMINAL HYSTERECTOMY     partial  . BREAST LUMPECTOMY WITH RADIOACTIVE SEED AND SENTINEL LYMPH NODE BIOPSY Right 12/12/2015   Procedure: RIGHT BREAST LUMPECTOMY WITH RADIOACTIVE SEED AND SENTINEL LYMPH NODE BIOPSY;  Surgeon: Excell Seltzer, MD;  Location: [facility];  Service: General;  Laterality: Right;  . BREAST SURGERY    . BUNIONECTOMY Left   . CHOLECYSTECTOMY  09/2010  . CHOLECYSTECTOMY    . CYST EXCISION Left    wrist  . DILATION AND CURETTAGE OF UTERUS    . FOOT SURGERY  2004   left  . HAND TENDON SURGERY Left 2012  . KNEE ARTHROSCOPY Left    x2  . KNEE SURGERY  2000, 2008   left  . PARTIAL HYSTERECTOMY  1989  . PORT-A-CATH REMOVAL Left 12/12/2015   Procedure: REMOVAL PORT-A-CATH;  Surgeon: Excell Seltzer, MD;  Location: [facility];  Service: General;  Laterality: Left;  . PORTACATH PLACEMENT Left 06/27/2015   Procedure: INSERTION PORT-A-CATH;  Surgeon: Excell Seltzer, MD;  Location: [facility];  Service: General;  Laterality: Left;  . PTOSIS REPAIR Bilateral 10/21/2016   Procedure: INTERNAL PTOSIS REPAIR;  Surgeon: Clista Bernhardt, MD;  Location: [facility];  Service: Plastics;  Laterality: Bilateral;  . WRIST  ARTHROSCOPY  06/29/2011   Procedure: ARTHROSCOPY WRIST;  Surgeon: Tennis Must;  Location: [facility];  Service: Orthopedics;  Laterality: Left;  left wrist tfcc repair     SOCIAL HISTORY:  Social History   Social History  . Marital status: Married    Spouse name: N/A  . Number of children: N/A  . Years of education: N/A   Occupational History  . Not on file.   Social History Main Topics  . Smoking status: Never Smoker  . Smokeless tobacco: Never Used  . Alcohol use No  . Drug use: No  . Sexual activity: Yes     Comment: married   Other Topics Concern  . Not on file   Social History Narrative   ** Merged History Encounter **  FAMILY HISTORY:  Family History  Problem Relation Age of Onset  . COPD Mother   . Emphysema Mother   . Diabetes Father   . Hypertension Father   . Stroke Father   . Kidney disease Sister        Stage III  . Thyroid disease Sister     CURRENT MEDICATIONS:  Outpatient Encounter Prescriptions as of 12/02/2016  Medication Sig  . acetaminophen (TYLENOL) 500 MG tablet Take 1,000 mg by mouth 2 (two) times daily as needed for moderate pain or headache.   . ALPRAZolam (XANAX) 1 MG tablet Take 1 tablet (1 mg total) by mouth at bedtime as needed for anxiety.  . cyanocobalamin 2000 MCG tablet Take 2,000 mcg by mouth daily.  Marland Kitchen esomeprazole (NEXIUM) 40 MG capsule TAKE 1 CAPSULE BY MOUTH EVERY DAY AT NOON (Patient taking differently: TAKE 1 CAPSULE BY MOUTH EVERY DAY)  . levothyroxine (SYNTHROID, LEVOTHROID) 75 MCG tablet Take 75 mcg by mouth daily before breakfast.  . loperamide (IMODIUM A-D) 2 MG tablet Take 2 mg by mouth as needed for diarrhea or loose stools.  . promethazine (PHENERGAN) 12.5 MG tablet Take 1 tablet (12.5 mg total) by mouth every 6 (six) hours as needed for nausea or vomiting.  . Travoprost, BAK Free, (TRAVATAN) 0.004 % SOLN ophthalmic solution Place 1 drop into both eyes at bedtime.  . Vitamin D, Ergocalciferol,  (DRISDOL) 50000 units CAPS capsule Take 50,000 Units by mouth every Monday.   . [DISCONTINUED] acetaminophen-codeine (TYLENOL #4) 300-60 MG tablet Take 1 tablet by mouth every 4 (four) hours as needed for moderate pain.  . [DISCONTINUED] mometasone (ELOCON) 0.1 % ointment Apply 1 application topically daily as needed (psoriasis).    No facility-administered encounter medications on file as of 12/02/2016.     ALLERGIES:  Allergies  Allergen Reactions  . Other Swelling    RAW GREEN PEPPERS THROAT SWELLING   . Paclitaxel Anaphylaxis    Due to cremaphor component most likely High fever  . Adhesive [Tape] Rash  . Amoxicillin Rash    "BAD RASH"  Has patient had a PCN reaction causing immediate rash, facial/tongue/throat swelling, SOB or lightheadedness with hypotension: #  #  #  YES  #  #  #  Has patient had a PCN reaction causing severe rash involving mucus membranes or skin necrosis: No Has patient had a PCN reaction that required hospitalization No Has patient had a PCN reaction occurring within the last 10 years: #  #  #  YES  #  #  #  If all of the above answers are "NO", then may proceed with Cephalosporin use.   . Codeine Nausea And Vomiting    Can take with nausea med  . Hydrocodone Nausea Only    States if she takes it she has to use phenergan also  . Meperidine Nausea And Vomiting     PHYSICAL EXAM:  ECOG Performance status: 1 - Symptomatic, but independent.   Vitals:   12/02/16 0901  BP: (!) 126/59  Pulse: 74  Resp: 16  Temp: 98.2 F (36.8 C)   Filed Weights   12/02/16 0901  Weight: 229 lb (103.9 kg)    Physical Exam  Constitutional: She is oriented to person, place, and time and well-developed, well-nourished, and in no distress.  HENT:  Head: Normocephalic.  Mouth/Throat: Oropharynx is clear and moist. No oropharyngeal exudate.  Eyes: Pupils are equal, round, and reactive to light. No scleral icterus.  Conjunctivae mildly erythematous  Neck: Normal  range of motion. Neck supple.  Cardiovascular: Normal rate and regular rhythm.   Pulmonary/Chest: Effort normal and breath sounds normal. No respiratory distress. She has no wheezes. She has no rales.    Abdominal: Soft. Bowel sounds are normal. There is no tenderness. There is no rebound and no guarding.  Musculoskeletal: Normal range of motion. She exhibits edema (Trace ankle edema bilaterally).  Lymphadenopathy:    She has no cervical adenopathy.  Neurological: She is alert and oriented to person, place, and time. No cranial nerve deficit. Gait normal.  Skin: Skin is warm and dry. No rash noted.  Psychiatric: Mood, memory, affect and judgment normal.  Nursing note and vitals reviewed.    LABORATORY DATA:  I have reviewed the labs as listed.  CBC    Component Value Date/Time   WBC 5.5 10/15/2016 0829   RBC 4.17 10/15/2016 0829   HGB 13.4 10/15/2016 0829   HCT 40.0 10/15/2016 0829   PLT 284 10/15/2016 0829   MCV 95.9 10/15/2016 0829   MCH 32.1 10/15/2016 0829   MCHC 33.5 10/15/2016 0829   RDW 12.5 10/15/2016 0829   LYMPHSABS 1.3 08/31/2016 0832   MONOABS 0.5 08/31/2016 0832   EOSABS 0.2 08/31/2016 0832   BASOSABS 0.0 08/31/2016 0832   CMP Latest Ref Rng & Units 10/15/2016 08/31/2016  Glucose 65 - 99 mg/dL 101(H) 105(H)  BUN 6 - 20 mg/dL 10 11  Creatinine 0.44 - 1.00 mg/dL 0.79 0.71  Sodium 135 - 145 mmol/L 140 140  Potassium 3.5 - 5.1 mmol/L 3.8 4.0  Chloride 101 - 111 mmol/L 107 109  CO2 22 - 32 mmol/L 24 24  Calcium 8.9 - 10.3 mg/dL 9.4 9.1  Total Protein 6.5 - 8.1 g/dL - 6.9  Total Bilirubin 0.3 - 1.2 mg/dL - 0.6  Alkaline Phos 38 - 126 U/L - 56  AST 15 - 41 U/L - 31  ALT 14 - 54 U/L - 32    PENDING LABS:    DIAGNOSTIC IMAGING:  *The following radiologic images and reports have been reviewed independently and agree with below findings.  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 Treated with neoadjuvant chemo with dose-dense Adriamycin/Cytoxan x 4 cycles, followed by Taxol x 2 cycles. Taxol was subsequently discontinued d/t reaction and Taxol was switched to Abraxane for remaining cycles-she completed chemotherapy on 10/28/15. She went on to have right breast lumpectomy, followed by adjuvant radiation therapy in [place] (dates unavailable).  -Adjuvant anti-estrogen therapy not recommended given triple negative disease.   -Clinical breast exam completed today and is not suspicious for recurrent disease. There is (R) breast lymphedema.  -Return to cancer center in 4 months for continued surveillance.  -NCCN Guidelines reviewed.     Recent onset nausea, vomiting, headaches, and dizziness:  -While she endorses periodic episodes of nausea and dizziness within the past year, her symptoms appear to be more progressed with 1 episode of vomiting last evening.   -I shared with her my concerns regarding triple negative breast cancer and possible areas of metastases, including the brain.  While, the nausea, vomiting, headaches, and dizziness could be unrelated to her history of cancer, I recommended we obtain MRI brain to rule our malignancy.  She was hesitant to proceed with  imaging and ultimately declined. She tells me that she had an MRI of her brain "less than a year ago."  Record reviewed, as well as CareEverywhere; I do not have access to MRI or any other brain imaging results.  She again declined to have MRI brain.   -Encouraged her to drink plenty of fluids over the next few days and include things like Gatorade/Powerade to help replace electrolytes as needed.  -Strongly recommended that she continue to monitor her symptoms and if they do not resolve within the next 2-3 days, then encouraged her to call us to let us know and we could proceed with  imaging. She agreed with this plan.  I will also ask my nurse to give her a call early next week to follow-up on her symptoms.    (R) breast lymphedema:  -Consistent with post-lumpectomy/post-radiation changes.  -Encouraged comfort measures including sleeping with the breast elevated, etc.  -Will offer her lymphedema support with physical therapy; can place orders if she would like referral.     Dispo:  -Return to cancer center in 4 months for continued surveillance with labs.    All questions were answered to patient's stated satisfaction. Encouraged patient to call with any new concerns or questions before her next visit to the cancer center and we can certain see her sooner, if needed.    Plan of care discussed with Dr. Talbert Cage, who agrees with the above aforementioned.    Orders placed this encounter:  No orders of the defined types were placed in this encounter.     Mike Craze, NP [place] [phone]

## 2016-12-02 NOTE — Telephone Encounter (Signed)
Pt called stating that she forgot to tell Elzie Rings that she has been having nose bleeds here and there for the last 4 months.  I informed Mike Craze NP of above and she stated that per the pt, during her visit she did not want to have any further testing at this time.

## 2016-12-02 NOTE — Patient Instructions (Addendum)
Betty Henry at [facility] Discharge Instructions  RECOMMENDATIONS MADE BY THE CONSULTANT AND ANY TEST RESULTS WILL BE SENT TO YOUR REFERRING PHYSICIAN.  You were seen today by Mike Craze NP. Call if nausea, vomiting and dizziness continues or gets worse. Return in 4 months for labs and follow up.    Thank you for choosing [place] at [facility] to provide your oncology and hematology care.  To afford each patient quality time with our provider, please arrive at least 15 minutes before your scheduled appointment time.    If you have a lab appointment with the [facility] please come in thru the  Main Entrance and check in at the main information desk  You need to re-schedule your appointment should you arrive 10 or more minutes late.  We strive to give you quality time with our providers, and arriving late affects you and other patients whose appointments are after yours.  Also, if you no show three or more times for appointments you may be dismissed from the clinic at the providers discretion.     Again, thank you for choosing [facility].  Our hope is that these requests will decrease the amount of time that you wait before being seen by our physicians.       _____________________________________________________________  Should you have questions after your visit to [facility], please contact our office at [phone] between the hours of 8:30 a.m. and 4:30 p.m.  Voicemails left after 4:30 p.m. will not be returned until the following business day.  For prescription refill requests, have your pharmacy contact our office.       Resources For Cancer Patients and their Caregivers ? American Cancer Society: Can assist with transportation, wigs, general needs, runs Look Good Feel Better.        684-784-2510 ? Cancer Care: Provides financial assistance, online support groups, medication/co-pay  assistance.  1-800-813-HOPE 407-145-9010) ? [facility] Assists [place] cancer patients and their families through emotional , educational and financial support.  651-749-0608 ? [place] DSS Where to apply for food stamps, Medicaid and utility assistance. 512-530-2690 ? [facility]: Transportation to medical appointments. (714) 068-0581 ? Social Security Administration: May apply for disability if have a Stage IV cancer. (838) 099-6825 (531)076-7872 ? [employer], Disability and Transit Services: Assists with nutrition, care and transit needs. [place] Support Programs: @10RELATIVEDAYS @ > Cancer Support Group  2nd Tuesday of the month 1pm-2pm, Journey Room  > Creative Journey  3rd Tuesday of the month 1130am-1pm, Journey Room  > Look Good Feel Better  1st Wednesday of the month 10am-12 noon, Journey Room (Call [place] to register [phone])

## 2016-12-09 ENCOUNTER — Other Ambulatory Visit (HOSPITAL_COMMUNITY): Payer: Self-pay | Admitting: Hematology & Oncology

## 2016-12-09 DIAGNOSIS — C50919 Malignant neoplasm of unspecified site of unspecified female breast: Secondary | ICD-10-CM

## 2017-02-22 ENCOUNTER — Telehealth (HOSPITAL_COMMUNITY): Payer: Self-pay | Admitting: Emergency Medicine

## 2017-02-22 NOTE — Telephone Encounter (Signed)
Left message for pt to call me back on 02/21/2017 and 02/22/2017 so I can ask her about genetic testing.

## 2017-02-22 NOTE — Telephone Encounter (Signed)
Spoke with pt about genetic testing today.  She said she would think about it and call me back.

## 2017-04-07 ENCOUNTER — Other Ambulatory Visit (HOSPITAL_COMMUNITY): Payer: Self-pay

## 2017-04-07 ENCOUNTER — Ambulatory Visit (HOSPITAL_COMMUNITY): Payer: Self-pay | Admitting: Adult Health

## 2017-04-08 ENCOUNTER — Other Ambulatory Visit (HOSPITAL_COMMUNITY): Payer: Self-pay

## 2017-04-08 ENCOUNTER — Ambulatory Visit (HOSPITAL_COMMUNITY): Payer: Self-pay | Admitting: Adult Health

## 2017-04-19 ENCOUNTER — Other Ambulatory Visit (HOSPITAL_COMMUNITY): Payer: Self-pay | Admitting: *Deleted

## 2017-04-19 DIAGNOSIS — C50919 Malignant neoplasm of unspecified site of unspecified female breast: Secondary | ICD-10-CM

## 2017-04-20 ENCOUNTER — Encounter (HOSPITAL_COMMUNITY): Payer: Self-pay | Admitting: Adult Health

## 2017-04-20 ENCOUNTER — Encounter (HOSPITAL_BASED_OUTPATIENT_CLINIC_OR_DEPARTMENT_OTHER): Payer: BLUE CROSS/BLUE SHIELD | Admitting: Adult Health

## 2017-04-20 ENCOUNTER — Encounter (HOSPITAL_COMMUNITY): Payer: BLUE CROSS/BLUE SHIELD | Attending: Oncology

## 2017-04-20 VITALS — BP 136/68 | HR 69 | Temp 98.0°F | Resp 22 | Wt 236.1 lb

## 2017-04-20 DIAGNOSIS — C50919 Malignant neoplasm of unspecified site of unspecified female breast: Secondary | ICD-10-CM | POA: Insufficient documentation

## 2017-04-20 DIAGNOSIS — Z923 Personal history of irradiation: Secondary | ICD-10-CM | POA: Diagnosis not present

## 2017-04-20 DIAGNOSIS — Z171 Estrogen receptor negative status [ER-]: Secondary | ICD-10-CM

## 2017-04-20 DIAGNOSIS — Z9221 Personal history of antineoplastic chemotherapy: Secondary | ICD-10-CM

## 2017-04-20 LAB — COMPREHENSIVE METABOLIC PANEL
ALK PHOS: 63 U/L (ref 38–126)
ALT: 32 U/L (ref 14–54)
ANION GAP: 6 (ref 5–15)
AST: 25 U/L (ref 15–41)
Albumin: 4 g/dL (ref 3.5–5.0)
BUN: 15 mg/dL (ref 6–20)
CALCIUM: 9.3 mg/dL (ref 8.9–10.3)
CO2: 25 mmol/L (ref 22–32)
Chloride: 108 mmol/L (ref 101–111)
Creatinine, Ser: 0.8 mg/dL (ref 0.44–1.00)
GFR calc non Af Amer: >60 mL/min (ref >60)
Glucose, Bld: 107 mg/dL — ABNORMAL HIGH (ref 65–99)
Potassium: 4.1 mmol/L (ref 3.5–5.1)
Sodium: 139 mmol/L (ref 135–145)
Total Bilirubin: 0.4 mg/dL (ref 0.3–1.2)
Total Protein: 7.2 g/dL (ref 6.5–8.1)

## 2017-04-20 LAB — CBC WITH DIFFERENTIAL/PLATELET
BASOS PCT: 1 %
Basophils Absolute: 0 10*3/uL (ref 0.0–0.1)
Eosinophils Absolute: 0.2 10*3/uL (ref 0.0–0.7)
Eosinophils Relative: 3 %
HEMATOCRIT: 39.4 % (ref 36.0–46.0)
HEMOGLOBIN: 13.4 g/dL (ref 12.0–15.0)
LYMPHS ABS: 1.5 10*3/uL (ref 0.7–4.0)
LYMPHS PCT: 29 %
MCH: 32.2 pg (ref 26.0–34.0)
MCHC: 34 g/dL (ref 30.0–36.0)
MCV: 94.7 fL (ref 78.0–100.0)
MONO ABS: 0.4 10*3/uL (ref 0.1–1.0)
MONOS PCT: 7 %
NEUTROS ABS: 3 10*3/uL (ref 1.7–7.7)
NEUTROS PCT: 60 %
Platelets: 267 10*3/uL (ref 150–400)
RBC: 4.16 MIL/uL (ref 3.87–5.11)
RDW: 12.8 % (ref 11.5–15.5)
WBC: 5 10*3/uL (ref 4.0–10.5)

## 2017-04-20 NOTE — Progress Notes (Signed)
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 - DUCTAL CARCINOMA IN SITU. - SEE COMMENT. Microscopic Comment The carcinoma appears grade 3. Per outside report, the tumor cells are negative for estrogen receptor, progesterone receptor and Her 2 neu. Ki-67 is 86%. (JBK:ds 06/17/15) JOSHUA KISH MD      06/20/2015 Imaging    MRI breast Right breast: In the upper-outer quadrant posterior 1/3 depth right breast, there is a 2.1 x 1.3 x 1.4 cm spiculated enhancing mass with washout enhancement kinetics and associated biopsy clip consistent with patient's known cancer.      06/30/2015 - 08/19/2015 Chemotherapy    Dose Dense AC x 4       09/02/2015 - 10/28/2015 Chemotherapy    Dose Dense Taxol X 2, reaction, then changed to abraxane       11/03/2015 Imaging    MRI breast Complete imaging response to neoadjuvant chemotherapy. No mass or abnormal enhancement is seen today.      12/12/2015 Surgery    RIGHT BREAST LUMPECTOMY WITH RADIOACTIVE SEED AND SENTINEL LYMPH NODE BIOPSY REMOVAL PORT-A-CATH, Dr. Excell Seltzer      12/12/2015 Pathology Results    Breast, lumpectomy, Right - FIBROSIS, HEMOSIDERIN DEPOSITION AND FOCAL GIANT CELL REACTION. - NO RESIDUAL TUMOR. - MARGINS NOT INVOLVED. 2. Lymph node, sentinel, biopsy, Right axillary #1 - ONE BENIGN LYMPH NODE (0/1). 3. Lymph node, sentinel, biopsy, Right axillary #2 - ONE BENIGN LYMPH NODE (0/1). 4. Lymph node, sentinel, biopsy, Right axillary #3 - ONE BENIGN LYMPH NODE (0/1). 5. Lymph node,  sentinel, biopsy, Right axillary #4 - ONE BENIGN LYMPH NODE (0/1).      08/03/2016 Mammogram    IMPRESSION: Lumpectomy and radiation changes of the right breast. No evidence of malignancy in either breast.        INTERVAL HISTORY:  Betty Henry 61 y.o. female returns for follow-up for history of triple negative right breast cancer.   Overall, she tells me she has been feeling "pretty good."  Appetite 100%; energy levels 50%.  She has been experiencing some reported "breathing problems" lately, namely dyspnea on exertion and feeling weak.  She saw her PCP about these concerns and she underwent recent CXR and cardiologist visit who performed stress test. She tells me all of her testing came back normal.  She thinks she feels weak and short of breath "because I'm just out of shape."    She has some peripheral neuropathy to her feet, which has been chronic and is largely unchanged.  She has chronic arthalgias to her knees and feet; currently rating pain 3/10 today on pain scale.    Denies any new breast problems. She continues to have some (R) breast swelling and associated tenderness; this has been a longstanding issue for her.  Denies any skin ulceration/changes or nipple discharge. Denies any axillary adenopathy that she is aware of.    She sees her PCP regularly. States that she is working on getting her screening colonoscopy scheduled sometime soon in [place].  Otherwise, she is largely without complaints today.  REVIEW OF SYSTEMS:  Review of Systems  Constitutional: Positive for fatigue. Negative for chills and fever.  HENT:  Negative.   Eyes: Negative.   Respiratory: Positive for shortness of breath (with exertion).   Cardiovascular: Negative.   Gastrointestinal: Negative.  Negative for abdominal pain, blood in stool, constipation, diarrhea, nausea and vomiting.  Endocrine: Negative.   Genitourinary: Negative.  Negative for dysuria, hematuria and vaginal bleeding.     Musculoskeletal: Positive for arthralgias.  Neurological: Positive for extremity weakness and numbness.  Hematological: Negative.  Negative for adenopathy.  Psychiatric/Behavioral: Negative.      PAST MEDICAL/SURGICAL HISTORY:  Past Medical History:  Diagnosis Date  . Achilles tendon pain   . Anemia   . Arthritis    knees  . Breast cancer ([facility])   . Cancer ([facility])    right breast-already finished chemo  . Cataracts, bilateral   . GERD (gastroesophageal reflux disease)   . Glaucoma   . Hypothyroidism   . Neuropathy    FROM CHEMO     . Psoriasis    elbows, knees  . Psoriasis    bil legs, elbows and hands  . TFCC (triangular fibrocartilage complex) tear    left   Past Surgical History:  Procedure Laterality Date  . ABDOMINAL HYSTERECTOMY     partial  . BREAST LUMPECTOMY WITH RADIOACTIVE SEED AND SENTINEL LYMPH NODE BIOPSY Right 12/12/2015   Procedure: RIGHT BREAST LUMPECTOMY WITH RADIOACTIVE SEED AND SENTINEL LYMPH NODE BIOPSY;  Surgeon: Excell Seltzer, MD;  Location: [facility];  Service: General;  Laterality: Right;  . BREAST SURGERY    . BUNIONECTOMY Left   . CHOLECYSTECTOMY  09/2010  . CHOLECYSTECTOMY    . CYST EXCISION Left    wrist  . DILATION AND CURETTAGE OF UTERUS    . FOOT SURGERY  2004   left  . HAND TENDON SURGERY Left 2012  . KNEE ARTHROSCOPY Left    x2  . KNEE SURGERY  2000, 2008   left  . PARTIAL HYSTERECTOMY  1989  . PORT-A-CATH REMOVAL Left 12/12/2015   Procedure: REMOVAL PORT-A-CATH;  Surgeon: Excell Seltzer, MD;  Location: [facility];  Service: General;  Laterality: Left;  . PORTACATH PLACEMENT Left 06/27/2015   Procedure: INSERTION PORT-A-CATH;  Surgeon: Excell Seltzer, MD;  Location: [facility];  Service: General;  Laterality: Left;  . PTOSIS REPAIR Bilateral 10/21/2016   Procedure: INTERNAL PTOSIS REPAIR;  Surgeon: Clista Bernhardt, MD;  Location: [facility];  Service: Plastics;  Laterality:  Bilateral;  . WRIST ARTHROSCOPY  06/29/2011   Procedure: ARTHROSCOPY WRIST;  Surgeon: Tennis Must;  Location: [facility];  Service: Orthopedics;  Laterality: Left;  left wrist tfcc repair     SOCIAL HISTORY:  Social History   Social History  . Marital status: Married    Spouse name: N/A  . Number of children: N/A  . Years of education: N/A   Occupational History  . Not on file.   Social History Main Topics  . Smoking status: Never Smoker  . Smokeless tobacco: Never Used  . Alcohol use No  . Drug use: No  . Sexual activity: Yes     Comment: married   Other Topics Concern  . Not on file   Social History Narrative   ** Merged History Encounter **        FAMILY HISTORY:  Family History  Problem Relation Age of Onset  . COPD Mother   . Emphysema  Mother   . Diabetes Father   . Hypertension Father   . Stroke Father   . Kidney disease Sister        Stage III  . Thyroid disease Sister     CURRENT MEDICATIONS:  Outpatient Encounter Prescriptions as of 04/20/2017  Medication Sig Note  . acetaminophen (TYLENOL) 500 MG tablet Take 1,000 mg by mouth 2 (two) times daily as needed for moderate pain or headache.    . ALPRAZolam (XANAX) 1 MG tablet Take 1 tablet (1 mg total) by mouth at bedtime as needed for anxiety.   . cyanocobalamin 2000 MCG tablet Take 2,000 mcg by mouth daily.   Betty Henry Kitchen esomeprazole (NEXIUM) 40 MG capsule TAKE 1 CAPSULE BY MOUTH EVERY DAY AT NOON   . gabapentin (NEURONTIN) 300 MG capsule gabapentin 300 mg caps  Three times a day 04/20/2017: Patient only takes 1 333m pill at hs  . levothyroxine (SYNTHROID, LEVOTHROID) 75 MCG tablet Take 75 mcg by mouth daily before breakfast.   . loperamide (IMODIUM A-D) 2 MG tablet Take 2 mg by mouth as needed for diarrhea or loose stools.   . promethazine (PHENERGAN) 12.5 MG tablet Take 1 tablet (12.5 mg total) by mouth every 6 (six) hours as needed for nausea or vomiting.   . Travoprost, BAK Free, (TRAVATAN)  0.004 % SOLN ophthalmic solution Place 1 drop into both eyes at bedtime.   . Vitamin D, Ergocalciferol, (DRISDOL) 50000 units CAPS capsule Take 50,000 Units by mouth every Monday.     No facility-administered encounter medications on file as of 04/20/2017.     ALLERGIES:  Allergies  Allergen Reactions  . Other Swelling    RAW GREEN PEPPERS THROAT SWELLING   . Paclitaxel Anaphylaxis    Due to cremaphor component most likely High fever  . Adhesive [Tape] Rash  . Amoxicillin Rash    "BAD RASH"  Has patient had a PCN reaction causing immediate rash, facial/tongue/throat swelling, SOB or lightheadedness with hypotension: #  #  #  YES  #  #  #  Has patient had a PCN reaction causing severe rash involving mucus membranes or skin necrosis: No Has patient had a PCN reaction that required hospitalization No Has patient had a PCN reaction occurring within the last 10 years: #  #  #  YES  #  #  #  If all of the above answers are "NO", then may proceed with Cephalosporin use.   . Codeine Nausea And Vomiting    Can take with nausea med  . Hydrocodone Nausea Only    States if she takes it she has to use phenergan also  . Meperidine Nausea And Vomiting     PHYSICAL EXAM:  ECOG Performance status: 1 - Symptomatic, but independent.   Vitals:   04/20/17 0841  BP: 136/68  Pulse: 69  Resp: (!) 22  Temp: 98 F (36.7 C)  SpO2: 96%   Filed Weights   04/20/17 0841  Weight: 236 lb 1.6 oz (107.1 kg)    Physical Exam  Constitutional: She is oriented to person, place, and time and well-developed, well-nourished, and in no distress.  HENT:  Head: Normocephalic.  Mouth/Throat: Oropharynx is clear and moist. No oropharyngeal exudate.  Eyes: Pupils are equal, round, and reactive to light. Conjunctivae are normal. No scleral icterus.  Neck: Normal range of motion. Neck supple.  Cardiovascular: Normal rate and regular rhythm.   Pulmonary/Chest: Effort normal and breath sounds normal. No  respiratory distress. She has no  wheezes.    Abdominal: Soft. Bowel sounds are normal. There is no tenderness.  Musculoskeletal: Normal range of motion. She exhibits no edema.  Lymphadenopathy:    She has no cervical adenopathy.       Right: No supraclavicular adenopathy present.       Left: No supraclavicular adenopathy present.  Neurological: She is alert and oriented to person, place, and time. No cranial nerve deficit. Gait normal.  Skin: Skin is warm and dry. No rash noted.  Psychiatric: Mood, memory, affect and judgment normal.  Nursing note and vitals reviewed.    LABORATORY DATA:  I have reviewed the labs as listed.  CBC    Component Value Date/Time   WBC 5.0 04/20/2017 0803   RBC 4.16 04/20/2017 0803   HGB 13.4 04/20/2017 0803   HCT 39.4 04/20/2017 0803   PLT 267 04/20/2017 0803   MCV 94.7 04/20/2017 0803   MCH 32.2 04/20/2017 0803   MCHC 34.0 04/20/2017 0803   RDW 12.8 04/20/2017 0803   LYMPHSABS 1.5 04/20/2017 0803   MONOABS 0.4 04/20/2017 0803   EOSABS 0.2 04/20/2017 0803   BASOSABS 0.0 04/20/2017 0803   CMP Latest Ref Rng & Units 04/20/2017 10/15/2016 08/31/2016  Glucose 65 - 99 mg/dL 107(H) 101(H) 105(H)  BUN 6 - 20 mg/dL 15 10 11   Creatinine 0.44 - 1.00 mg/dL 0.80 0.79 0.71  Sodium 135 - 145 mmol/L 139 140 140  Potassium 3.5 - 5.1 mmol/L 4.1 3.8 4.0  Chloride 101 - 111 mmol/L 108 107 109  CO2 22 - 32 mmol/L 25 24 24   Calcium 8.9 - 10.3 mg/dL 9.3 9.4 9.1  Total Protein 6.5 - 8.1 g/dL 7.2 - 6.9  Total Bilirubin 0.3 - 1.2 mg/dL 0.4 - 0.6  Alkaline Phos 38 - 126 U/L 63 - 56  AST 15 - 41 U/L 25 - 31  ALT 14 - 54 U/L 32 - 32    PENDING LABS:    DIAGNOSTIC IMAGING:  *The following radiologic images and reports have been reviewed independently and agree with below findings.  Most recent mammogram: 08/03/16 CLINICAL DATA: Annual examination of both breasts. The patient  underwent lumpectomy for right breast cancer in May 2017, followed  by radiation  therapy and chemotherapy.   EXAM:  2D DIGITAL DIAGNOSTIC BILATERAL MAMMOGRAM WITH CAD AND ADJUNCT TOMO  COMPARISON: Previous exam(s).  ACR Breast Density Category b: There are scattered areas of  fibroglandular density.   FINDINGS:  There are lumpectomy changes and a a 3.0 cm postoperative seroma in  the deep upper outer right breast. There is mild skin thickening of  the right breast consistent with radiation therapy. No mass,  nonsurgical distortion, or suspicious microcalcification is  identified in either breast to suggest malignancy.  Mammographic images were processed with CAD.   IMPRESSION:  Lumpectomy and radiation changes of the right breast. No evidence of  malignancy in either breast.   RECOMMENDATION:  Diagnostic mammogram is suggested in 1 year. (Code:[id number]-B-01Y)  I have discussed the findings and recommendations with the patient.  Results were also provided in writing at the conclusion of the  visit. If applicable, a reminder letter will be sent to the patient  regarding the next appointment.   BI-RADS CATEGORY 2: Benign.   Electronically Signed  By: Curlene Dolphin M.D.  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 Treated with neoadjuvant chemo with dose-dense Adriamycin/Cytoxan x 4 cycles, followed by Taxol x 2 cycles. Taxol was subsequently discontinued d/t reaction and Taxol was switched to Abraxane for remaining cycles-she completed chemotherapy on 10/28/15. She went on to have right breast lumpectomy, followed by adjuvant radiation therapy in [place] (dates unavailable).  -Adjuvant anti-estrogen therapy not recommended given triple negative disease.   -Last mammogram 08/03/16 negative for recurrent disease; annual mammogram due in 07/2017; orders placed today.    -Clinical breast exam done today and negative.  -Return to cancer center in 6 months for continued surveillance.  -NCCN Guidelines reviewed.       (R) breast lymphedema:  -Consistent with post-lumpectomy/post-radiation changes. It is slightly improved from previous exam, but lymphedema still present  -Discussed referral to PT for (R) breast lymphedema massage. She agreed. Referral to PT placed today.    Health maintenance/Wellness:  -Encouraged healthy diet and exercise as tolerated. Recommended she look into [place] [facility] through the [facility], which may be available to her in [place] closer to home. This would be a wonderful opportunity for her to become more physically active under the supervision of trainers specializing in the care of cancer survivors.   -Recommended continued follow-up with her PCP and other specialists as directed.  She is planning for her screening colonoscopy sometime soon in [place].  She is reportedly up-to-date with her other cancer screening exams.       Dispo:  -Referral to outpatient PT for right breast lymphedema.  -Annual mammogram due in 07/2017; orders placed today.  -Return to cancer center in 6 months for continued surveillance with labs.    All questions were answered to patient's stated satisfaction. Encouraged patient to call with any new concerns or questions before her next visit to the cancer center and we can certain see her sooner, if needed.    Plan of care discussed with Dr. Talbert Cage, who agrees with the above aforementioned.    Orders placed this encounter:  Orders Placed This Encounter  Procedures  . MM DIAG BREAST TOMO BILATERAL      Mike Craze, NP [place] [phone]

## 2017-04-20 NOTE — Patient Instructions (Addendum)
[facility] at [facility] Discharge Instructions  RECOMMENDATIONS MADE BY THE CONSULTANT AND ANY TEST RESULTS WILL BE SENT TO YOUR REFERRING PHYSICIAN  You were seen today by Mike Craze NP. Your mammogram is due in January 2019. Return in 6 months for labs and follow up. See Amy at checkout for appointments.  Thank you for choosing [place] at [facility] to provide your oncology and hematology care.  To afford each patient quality time with our provider, please arrive at least 15 minutes before your scheduled appointment time.    If you have a lab appointment with the [facility] please come in thru the  Main Entrance and check in at the main information desk  You need to re-schedule your appointment should you arrive 10 or more minutes late.  We strive to give you quality time with our providers, and arriving late affects you and other patients whose appointments are after yours.  Also, if you no show three or more times for appointments you may be dismissed from the clinic at the providers discretion.     Again, thank you for choosing [facility].  Our hope is that these requests will decrease the amount of time that you wait before being seen by our physicians.       _____________________________________________________________  Should you have questions after your visit to [facility], please contact our office at [phone] between the hours of 8:30 a.m. and 4:30 p.m.  Voicemails left after 4:30 p.m. will not be returned until the following business day.  For prescription refill requests, have your pharmacy contact our office.       Resources For Cancer Patients and their Caregivers ? American Cancer Society: Can assist with transportation, wigs, general needs, runs Look Good Feel Better.        915-399-4080 ? Cancer Care: Provides financial assistance, online support groups, medication/co-pay  assistance.  1-800-813-HOPE (365)364-1888) ? [facility] Assists [place] cancer patients and their families through emotional , educational and financial support.  9415983979 ? [place] DSS Where to apply for food stamps, Medicaid and utility assistance. 5736487422 ? [facility]: Transportation to medical appointments. 434-174-6132 ? Social Security Administration: May apply for disability if have a Stage IV cancer. 442-513-1291 (815)888-8533 ? [employer], Disability and Transit Services: Assists with nutrition, care and transit needs. [facility] Support Programs: @10RELATIVEDAYS @ > Cancer Support Group  2nd Tuesday of the month 1pm-2pm, Journey Room  > Creative Journey  3rd Tuesday of the month 1130am-1pm, Journey Room  > Look Good Feel Better  1st Wednesday of the month 10am-12 noon, Journey Room (Call [place] to register [phone])

## 2017-05-24 ENCOUNTER — Encounter (HOSPITAL_COMMUNITY): Payer: Self-pay

## 2017-06-05 ENCOUNTER — Other Ambulatory Visit (HOSPITAL_COMMUNITY): Payer: Self-pay | Admitting: Oncology

## 2017-06-05 DIAGNOSIS — C50919 Malignant neoplasm of unspecified site of unspecified female breast: Secondary | ICD-10-CM

## 2017-08-08 ENCOUNTER — Other Ambulatory Visit: Payer: Self-pay | Admitting: Adult Health

## 2017-08-08 DIAGNOSIS — R928 Other abnormal and inconclusive findings on diagnostic imaging of breast: Secondary | ICD-10-CM

## 2017-08-09 ENCOUNTER — Ambulatory Visit (HOSPITAL_COMMUNITY)
Admission: RE | Admit: 2017-08-09 | Discharge: 2017-08-09 | Disposition: A | Discharge status: Home / Self Care | Payer: BLUE CROSS/BLUE SHIELD | Source: Ambulatory Visit | Attending: Adult Health | Admitting: Adult Health

## 2017-08-09 DIAGNOSIS — C50911 Malignant neoplasm of unspecified site of right female breast: Secondary | ICD-10-CM | POA: Insufficient documentation

## 2017-08-09 DIAGNOSIS — C50919 Malignant neoplasm of unspecified site of unspecified female breast: Secondary | ICD-10-CM

## 2017-08-09 DIAGNOSIS — Z9889 Other specified postprocedural states: Secondary | ICD-10-CM | POA: Insufficient documentation

## 2017-10-17 ENCOUNTER — Other Ambulatory Visit (HOSPITAL_COMMUNITY): Payer: Self-pay | Admitting: *Deleted

## 2017-10-17 DIAGNOSIS — C50919 Malignant neoplasm of unspecified site of unspecified female breast: Secondary | ICD-10-CM

## 2017-10-18 ENCOUNTER — Inpatient Hospital Stay (HOSPITAL_COMMUNITY): Payer: BLUE CROSS/BLUE SHIELD | Attending: Internal Medicine

## 2017-10-18 ENCOUNTER — Inpatient Hospital Stay (HOSPITAL_BASED_OUTPATIENT_CLINIC_OR_DEPARTMENT_OTHER): Payer: BLUE CROSS/BLUE SHIELD | Admitting: Adult Health

## 2017-10-18 ENCOUNTER — Encounter (HOSPITAL_COMMUNITY): Payer: Self-pay | Admitting: Adult Health

## 2017-10-18 VITALS — BP 127/74 | HR 76 | Temp 97.5°F | Resp 18 | Wt 242.1 lb

## 2017-10-18 DIAGNOSIS — G629 Polyneuropathy, unspecified: Secondary | ICD-10-CM

## 2017-10-18 DIAGNOSIS — Z853 Personal history of malignant neoplasm of breast: Secondary | ICD-10-CM

## 2017-10-18 DIAGNOSIS — C50919 Malignant neoplasm of unspecified site of unspecified female breast: Secondary | ICD-10-CM

## 2017-10-18 LAB — COMPREHENSIVE METABOLIC PANEL
ALBUMIN: 3.8 g/dL (ref 3.5–5.0)
ALT: 31 U/L (ref 14–54)
AST: 23 U/L (ref 15–41)
Alkaline Phosphatase: 62 U/L (ref 38–126)
Anion gap: 10 (ref 5–15)
BUN: 20 mg/dL (ref 6–20)
CHLORIDE: 105 mmol/L (ref 101–111)
CO2: 24 mmol/L (ref 22–32)
Calcium: 9.3 mg/dL (ref 8.9–10.3)
Creatinine, Ser: 0.82 mg/dL (ref 0.44–1.00)
GFR calc Af Amer: >60 mL/min (ref >60)
GFR calc non Af Amer: >60 mL/min (ref >60)
GLUCOSE: 113 mg/dL — AB (ref 65–99)
POTASSIUM: 4.1 mmol/L (ref 3.5–5.1)
Sodium: 139 mmol/L (ref 135–145)
Total Bilirubin: 0.8 mg/dL (ref 0.3–1.2)
Total Protein: 7.2 g/dL (ref 6.5–8.1)

## 2017-10-18 LAB — CBC WITH DIFFERENTIAL/PLATELET
BASOS ABS: 0 10*3/uL (ref 0.0–0.1)
Basophils Relative: 1 %
EOS PCT: 4 %
Eosinophils Absolute: 0.2 10*3/uL (ref 0.0–0.7)
HEMATOCRIT: 40.5 % (ref 36.0–46.0)
Hemoglobin: 13.4 g/dL (ref 12.0–15.0)
LYMPHS PCT: 27 %
Lymphs Abs: 1.6 10*3/uL (ref 0.7–4.0)
MCH: 31.8 pg (ref 26.0–34.0)
MCHC: 33.1 g/dL (ref 30.0–36.0)
MCV: 96.2 fL (ref 78.0–100.0)
MONO ABS: 0.4 10*3/uL (ref 0.1–1.0)
Monocytes Relative: 7 %
NEUTROS ABS: 3.6 10*3/uL (ref 1.7–7.7)
Neutrophils Relative %: 61 %
PLATELETS: 258 10*3/uL (ref 150–400)
RBC: 4.21 MIL/uL (ref 3.87–5.11)
RDW: 12.9 % (ref 11.5–15.5)
WBC: 5.8 10*3/uL (ref 4.0–10.5)

## 2017-10-18 NOTE — Progress Notes (Signed)
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 - DUCTAL CARCINOMA IN SITU. - SEE COMMENT. Microscopic Comment The carcinoma appears grade 3. Per outside report, the tumor cells are negative for estrogen receptor, progesterone receptor and Her 2 neu. Ki-67 is 86%. (JBK:ds 06/17/15) JOSHUA KISH MD      06/20/2015 Imaging    MRI breast Right breast: In the upper-outer quadrant posterior 1/3 depth right breast, there is a 2.1 x 1.3 x 1.4 cm spiculated enhancing mass with washout enhancement kinetics and associated biopsy clip consistent with patient's known cancer.      06/30/2015 - 08/19/2015 Chemotherapy    Dose Dense AC x 4       09/02/2015 - 10/28/2015 Chemotherapy    Dose Dense Taxol X 2, reaction, then changed to abraxane       11/03/2015 Imaging    MRI breast Complete imaging response to neoadjuvant chemotherapy. No mass or abnormal enhancement is seen today.      12/12/2015 Surgery    RIGHT BREAST LUMPECTOMY WITH RADIOACTIVE SEED AND SENTINEL LYMPH NODE BIOPSY REMOVAL PORT-A-CATH, Dr. Excell Seltzer      12/12/2015 Pathology Results    Breast, lumpectomy, Right - FIBROSIS, HEMOSIDERIN DEPOSITION AND FOCAL GIANT CELL REACTION. - NO RESIDUAL TUMOR. - MARGINS NOT INVOLVED. 2. Lymph node, sentinel, biopsy, Right axillary #1 - ONE BENIGN LYMPH NODE (0/1). 3. Lymph node, sentinel, biopsy, Right axillary #2 - ONE BENIGN LYMPH NODE (0/1). 4. Lymph node, sentinel, biopsy, Right axillary #3 - ONE BENIGN LYMPH NODE (0/1). 5. Lymph node,  sentinel, biopsy, Right axillary #4 - ONE BENIGN LYMPH NODE (0/1).      08/03/2016 Mammogram    IMPRESSION: Lumpectomy and radiation changes of the right breast. No evidence of malignancy in either breast.        INTERVAL HISTORY:  Betty Henry 62 y.o. female returns for follow-up for history of triple negative right breast cancer.   Here today unaccompanied.    Overall, she tells me she has been feeling "pretty good."  Appetite 75-100%; energy levels 50%. She struggles with chronic fatigue, peripheral neuropathy to her feet, dyspnea on exertion, periodic leg swelling, and chronic nausea that comes & goes. She tells me that her podiatrist had given her samples of Lyrica in the past for her peripheral neuropathy, but she never tried it because she was concerned about side effects.   She tells me that she feels a lump "off and on" to her (R) lateral breast.  It is easier to feel when she is lying down in bed.  Her last mammogram in 07/2017 was negative.  She continues to have chronic breast tenderness, which is stable.    Otherwise, she is largely without other complaints today.       REVIEW OF SYSTEMS:  Review of Systems  Constitutional: Positive for fatigue. Negative for chills and fever.  HENT:  Negative.   Eyes: Negative.   Respiratory: Positive for shortness of breath.   Cardiovascular: Positive for leg swelling.  Gastrointestinal: Positive for nausea.  Endocrine: Negative.  Genitourinary: Negative.    Musculoskeletal: Negative.   Skin: Negative.   Neurological: Positive for numbness.  Hematological: Negative.   Psychiatric/Behavioral: Negative.      PAST MEDICAL/SURGICAL HISTORY:  Past Medical History:  Diagnosis Date  . Achilles tendon pain   . Anemia   . Arthritis    knees  . Breast cancer ([place])   . Cancer ([facility])    right breast-already finished chemo  . Cataracts, bilateral   . GERD (gastroesophageal reflux disease)   . Glaucoma   . Hypothyroidism   .  Neuropathy    FROM CHEMO     . Psoriasis    elbows, knees  . Psoriasis    bil legs, elbows and hands  . TFCC (triangular fibrocartilage complex) tear    left   Past Surgical History:  Procedure Laterality Date  . ABDOMINAL HYSTERECTOMY     partial  . BREAST LUMPECTOMY WITH RADIOACTIVE SEED AND SENTINEL LYMPH NODE BIOPSY Right 12/12/2015   Procedure: RIGHT BREAST LUMPECTOMY WITH RADIOACTIVE SEED AND SENTINEL LYMPH NODE BIOPSY;  Surgeon: Excell Seltzer, MD;  Location: [facility];  Service: General;  Laterality: Right;  . BREAST SURGERY    . BUNIONECTOMY Left   . CHOLECYSTECTOMY  09/2010  . CHOLECYSTECTOMY    . CYST EXCISION Left    wrist  . DILATION AND CURETTAGE OF UTERUS    . FOOT SURGERY  2004   left  . HAND TENDON SURGERY Left 2012  . KNEE ARTHROSCOPY Left    x2  . KNEE SURGERY  2000, 2008   left  . PARTIAL HYSTERECTOMY  1989  . PORT-A-CATH REMOVAL Left 12/12/2015   Procedure: REMOVAL PORT-A-CATH;  Surgeon: Excell Seltzer, MD;  Location: [facility];  Service: General;  Laterality: Left;  . PORTACATH PLACEMENT Left 06/27/2015   Procedure: INSERTION PORT-A-CATH;  Surgeon: Excell Seltzer, MD;  Location: [facility];  Service: General;  Laterality: Left;  . PTOSIS REPAIR Bilateral 10/21/2016   Procedure: INTERNAL PTOSIS REPAIR;  Surgeon: Clista Bernhardt, MD;  Location: [facility];  Service: Plastics;  Laterality: Bilateral;  . WRIST ARTHROSCOPY  06/29/2011   Procedure: ARTHROSCOPY WRIST;  Surgeon: Tennis Must;  Location: [facility];  Service: Orthopedics;  Laterality: Left;  left wrist tfcc repair     SOCIAL HISTORY:  Social History   Socioeconomic History  . Marital status: Married    Spouse name: Not on file  . Number of children: Not on file  . Years of education: Not on file  . Highest education level: Not on file  Occupational History  . Not on file  Social Needs  . Financial resource strain: Not on  file  . Food insecurity:    Worry: Not on file    Inability: Not on file  . Transportation needs:    Medical: Not on file    Non-medical: Not on file  Tobacco Use  . Smoking status: Never Smoker  . Smokeless tobacco: Never Used  Substance and Sexual Activity  . Alcohol use: No  . Drug use: No  . Sexual activity: Yes    Comment: married  Lifestyle  . Physical activity:    Days per week: Not on file    Minutes per session: Not on file  . Stress: Not on file  Relationships  . Social connections:    Talks on phone: Not on file    Gets together: Not on file    Attends religious service:  Not on file    Active member of club or organization: Not on file    Attends meetings of clubs or organizations: Not on file    Relationship status: Not on file  . Intimate partner violence:    Fear of current or ex partner: Not on file    Emotionally abused: Not on file    Physically abused: Not on file    Forced sexual activity: Not on file  Other Topics Concern  . Not on file  Social History Narrative   ** Merged History Encounter **        FAMILY HISTORY:  Family History  Problem Relation Age of Onset  . COPD Mother   . Emphysema Mother   . Diabetes Father   . Hypertension Father   . Stroke Father   . Kidney disease Sister        Stage III  . Thyroid disease Sister     CURRENT MEDICATIONS:  Outpatient Encounter Medications as of 10/18/2017  Medication Sig Note  . acetaminophen (TYLENOL) 500 MG tablet Take 1,000 mg by mouth 2 (two) times daily as needed for moderate pain or headache.    . ALPRAZolam (XANAX) 1 MG tablet Take 1 tablet (1 mg total) by mouth at bedtime as needed for anxiety.   . cyanocobalamin 2000 MCG tablet Take 2,000 mcg by mouth daily.   . Dietary Management Product (XYZBAC) TABS TAKE 1 TABLET BY MOUTH EVERY DAY - use restat coupon   . esomeprazole (NEXIUM) 40 MG capsule TAKE ONE CAPSULE BY MOUTH EVERY DAY AT NOON   . gabapentin (NEURONTIN) 300 MG capsule  gabapentin 300 mg caps  Three times a day 04/20/2017: Patient only takes 1 327m pill at hs  . levothyroxine (SYNTHROID, LEVOTHROID) 75 MCG tablet Take 75 mcg by mouth daily before breakfast.   . loperamide (IMODIUM A-D) 2 MG tablet Take 2 mg by mouth as needed for diarrhea or loose stools.   . promethazine (PHENERGAN) 12.5 MG tablet Take 1 tablet (12.5 mg total) by mouth every 6 (six) hours as needed for nausea or vomiting.   . Travoprost, BAK Free, (TRAVATAN) 0.004 % SOLN ophthalmic solution Place 1 drop into both eyes at bedtime.   . Vitamin D, Ergocalciferol, (DRISDOL) 50000 units CAPS capsule Take 50,000 Units by mouth every Monday.     No facility-administered encounter medications on file as of 10/18/2017.     ALLERGIES:  Allergies  Allergen Reactions  . Other Swelling    RAW GREEN PEPPERS THROAT SWELLING   . Paclitaxel Anaphylaxis    Due to cremaphor component most likely High fever  . Adhesive [Tape] Rash  . Amoxicillin Rash    "BAD RASH"  Has patient had a PCN reaction causing immediate rash, facial/tongue/throat swelling, SOB or lightheadedness with hypotension: #  #  #  YES  #  #  #  Has patient had a PCN reaction causing severe rash involving mucus membranes or skin necrosis: No Has patient had a PCN reaction that required hospitalization No Has patient had a PCN reaction occurring within the last 10 years: #  #  #  YES  #  #  #  If all of the above answers are "NO", then may proceed with Cephalosporin use.   . Codeine Nausea And Vomiting    Can take with nausea med  . Hydrocodone Nausea Only    States if she takes it she has to use phenergan also  . Meperidine Nausea And  Vomiting     PHYSICAL EXAM:  ECOG Performance status: 1 - Symptomatic, but independent.   Vitals:   10/18/17 0830  BP: 127/74  Pulse: 76  Resp: 18  Temp: (!) 97.5 F (36.4 C)  SpO2: 96%   Filed Weights   10/18/17 0830  Weight: 242 lb 1.6 oz (109.8 kg)    Physical Exam    Constitutional: She is oriented to person, place, and time and well-developed, well-nourished, and in no distress.  HENT:  Head: Normocephalic.  Mouth/Throat: Oropharynx is clear and moist. No oropharyngeal exudate.  Eyes: Pupils are equal, round, and reactive to light. Conjunctivae are normal. No scleral icterus.  Neck: Normal range of motion. Neck supple.  Cardiovascular: Normal rate and regular rhythm.  Pulmonary/Chest: Effort normal. No respiratory distress. She has wheezes (mild expiratory wheezes to bilat bases ).    Abdominal: Soft. Bowel sounds are normal. There is no tenderness.  Musculoskeletal: Normal range of motion. She exhibits edema (Trace BLE/ankle edema).  Lymphadenopathy:    She has no cervical adenopathy.       Right: No supraclavicular adenopathy present.       Left: No supraclavicular adenopathy present.  Neurological: She is alert and oriented to person, place, and time. No cranial nerve deficit. Gait normal.  Skin: Skin is warm and dry. No rash noted.  Psychiatric: Mood, memory, affect and judgment normal.  Nursing note and vitals reviewed.    LABORATORY DATA:  I have reviewed the labs as listed.  CBC    Component Value Date/Time   WBC 5.8 10/18/2017 0820   RBC 4.21 10/18/2017 0820   HGB 13.4 10/18/2017 0820   HCT 40.5 10/18/2017 0820   PLT 258 10/18/2017 0820   MCV 96.2 10/18/2017 0820   MCH 31.8 10/18/2017 0820   MCHC 33.1 10/18/2017 0820   RDW 12.9 10/18/2017 0820   LYMPHSABS 1.6 10/18/2017 0820   MONOABS 0.4 10/18/2017 0820   EOSABS 0.2 10/18/2017 0820   BASOSABS 0.0 10/18/2017 0820   CMP Latest Ref Rng & Units 10/18/2017 04/20/2017 10/15/2016  Glucose 65 - 99 mg/dL 113(H) 107(H) 101(H)  BUN 6 - 20 mg/dL 20 15 10   Creatinine 0.44 - 1.00 mg/dL 0.82 0.80 0.79  Sodium 135 - 145 mmol/L 139 139 140  Potassium 3.5 - 5.1 mmol/L 4.1 4.1 3.8  Chloride 101 - 111 mmol/L 105 108 107  CO2 22 - 32 mmol/L 24 25 24   Calcium 8.9 - 10.3 mg/dL 9.3 9.3 9.4   Total Protein 6.5 - 8.1 g/dL 7.2 7.2 -  Total Bilirubin 0.3 - 1.2 mg/dL 0.8 0.4 -  Alkaline Phos 38 - 126 U/L 62 63 -  AST 15 - 41 U/L 23 25 -  ALT 14 - 54 U/L 31 32 -    PENDING LABS:    DIAGNOSTIC IMAGING:  *The following radiologic images and reports have been reviewed independently and agree with below findings.  Most recent mammogram: 08/09/17 CLINICAL DATA:  62 year old female presenting for routine annual surveillance status post right breast lumpectomy and 2017.  EXAM: 2D DIGITAL DIAGNOSTIC BILATERAL MAMMOGRAM WITH CAD AND ADJUNCT TOMO  COMPARISON:  Previous exam(s).  ACR Breast Density Category b: There are scattered areas of fibroglandular density.  FINDINGS: The right breast lumpectomy site is stable. No suspicious calcifications, masses or areas of distortion are seen in the bilateral breasts.  Mammographic images were processed with CAD.  IMPRESSION: Stable right breast lumpectomy site. No mammographic evidence of malignancy in the bilateral breasts.  RECOMMENDATION: Diagnostic mammogram is suggested in 1 year. (Code:[id number]-B-01Y)  I have discussed the findings and recommendations with the patient. Results were also provided in writing at the conclusion of the visit. If applicable, a reminder letter will be sent to the patient regarding the next appointment.  BI-RADS CATEGORY  2: Benign.   Electronically Signed   By: Ammie Ferrier M.D.   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 Treated with neoadjuvant chemo with dose-dense Adriamycin/Cytoxan x 4 cycles, followed by Taxol x 2 cycles. Taxol was subsequently discontinued d/t reaction and Taxol was switched to Abraxane for remaining cycles-she completed chemotherapy on 10/28/15. She went on to have right breast  lumpectomy, followed by adjuvant radiation therapy in [place] (dates unavailable).  -Adjuvant anti-estrogen therapy not recommended given triple negative disease.  She was offered genetic counseling in the past, but there was passive refusal in 2018 (patient never returned call about her decision to pursue genetic testing).   Discussed option of return referral for genetic counseling, based on recommendations for NCCN and Mirissa Lopresti, our genetic counselor. Shared with her the benefits of genetic testing, both in terms of her own cancer risk and those risks of her family members. She agreed to undergo genetic counseling.  Will place referral today.  -Last mammogram 08/09/17 negative for recurrent disease; she will be due for repeat annual diagnostic mammogram in 07/2018; will place orders at subsequent follow-up visit.  -Clinical breast exam done today and negative. Labs reviewed in detail with patient today and are stable. No oncologic reason for labs at next visit.  -Return to cancer center in 6 months for follow-up; no labs needed.    Bilat LE peripheral neuropathy:  -Chronic since completing chemotherapy. Has seen podiatry in the past per her report and was given samples of Lyrica. She has been hesitant to try Lyrica d/t concerns for possible side effects. She has tried gabapentin in the past, which was not effective.  Discussed option of trial of Cymbalta as well and explained mechanism of action. She prefers to give the Lyrica a try since she already has free samples of this medication.  Encouraged her to follow-up with her podiatrist for refills if the Lyrica is effective.     Health maintenance/Wellness:  -Encouraged healthy diet and exercise as tolerated.       Dispo:  -Refer to genetic counseling; orders placed today.  -Return to cancer center for follow-up in 6 months; no labs needed.     All questions were answered to patient's stated satisfaction. Encouraged patient to call with any  new concerns or questions before her next visit to the cancer center and we can certain see her sooner, if needed.     Orders placed this encounter:  Orders Placed This Encounter  Procedures  . Ambulatory referral to Morene Rankins, NP [facility] [phone]

## 2017-10-18 NOTE — Patient Instructions (Signed)
[place] at [facility] Discharge Instructions  You were seen by Mike Craze NP today. Follow-up as scheduled in 6 months with no labs needed   Thank you for choosing [facility] at [facility] to provide your oncology and hematology care.  To afford each patient quality time with our provider, please arrive at least 15 minutes before your scheduled appointment time.   If you have a lab appointment with the [facility] please come in thru the  Main Entrance and check in at the main information desk  You need to re-schedule your appointment should you arrive 10 or more minutes late.  We strive to give you quality time with our providers, and arriving late affects you and other patients whose appointments are after yours.  Also, if you no show three or more times for appointments you may be dismissed from the clinic at the providers discretion.     Again, thank you for choosing [facility].  Our hope is that these requests will decrease the amount of time that you wait before being seen by our physicians.       _____________________________________________________________  Should you have questions after your visit to [facility], please contact our office at [phone] between the hours of 8:30 a.m. and 4:30 p.m.  Voicemails left after 4:30 p.m. will not be returned until the following business day.  For prescription refill requests, have your pharmacy contact our office.       Resources For Cancer Patients and their Caregivers ? American Cancer Society: Can assist with transportation, wigs, general needs, runs Look Good Feel Better.        (519) 504-3685 ? Cancer Care: Provides financial assistance, online support groups, medication/co-pay assistance.  1-800-813-HOPE 252-064-3416) ? [place] Assists [place] cancer patients and their families through emotional , educational and  financial support.  540-807-0514 ? [place] DSS Where to apply for food stamps, Medicaid and utility assistance. 934-350-6605 ? [facility]: Transportation to medical appointments. (618) 235-0033 ? Social Security Administration: May apply for disability if have a Stage IV cancer. 816 611 3656 (907)048-3917 ? [employer], Disability and Transit Services: Assists with nutrition, care and transit needs. [place] Support Programs:   > Cancer Support Group  2nd Tuesday of the month 1pm-2pm, Journey Room   > Creative Journey  3rd Tuesday of the month 1130am-1pm, Journey Room

## 2018-04-21 ENCOUNTER — Ambulatory Visit (HOSPITAL_COMMUNITY): Payer: Self-pay | Admitting: Hematology

## 2018-05-05 ENCOUNTER — Other Ambulatory Visit (HOSPITAL_COMMUNITY): Payer: Self-pay | Admitting: *Deleted

## 2018-05-05 DIAGNOSIS — C50919 Malignant neoplasm of unspecified site of unspecified female breast: Secondary | ICD-10-CM

## 2018-05-08 ENCOUNTER — Other Ambulatory Visit (HOSPITAL_COMMUNITY): Payer: Self-pay

## 2018-05-15 ENCOUNTER — Other Ambulatory Visit: Payer: Self-pay

## 2018-05-15 ENCOUNTER — Inpatient Hospital Stay (HOSPITAL_COMMUNITY): Payer: BLUE CROSS/BLUE SHIELD | Attending: Internal Medicine | Admitting: Internal Medicine

## 2018-05-15 ENCOUNTER — Encounter (HOSPITAL_COMMUNITY): Payer: Self-pay | Admitting: Internal Medicine

## 2018-05-15 VITALS — BP 128/68 | HR 75 | Temp 98.0°F | Resp 20 | Wt 242.4 lb

## 2018-05-15 DIAGNOSIS — N644 Mastodynia: Secondary | ICD-10-CM

## 2018-05-15 DIAGNOSIS — Z923 Personal history of irradiation: Secondary | ICD-10-CM

## 2018-05-15 DIAGNOSIS — N6311 Unspecified lump in the right breast, upper outer quadrant: Secondary | ICD-10-CM

## 2018-05-15 DIAGNOSIS — L409 Psoriasis, unspecified: Secondary | ICD-10-CM | POA: Insufficient documentation

## 2018-05-15 DIAGNOSIS — Z853 Personal history of malignant neoplasm of breast: Secondary | ICD-10-CM | POA: Insufficient documentation

## 2018-05-15 DIAGNOSIS — C50919 Malignant neoplasm of unspecified site of unspecified female breast: Secondary | ICD-10-CM

## 2018-05-15 DIAGNOSIS — Z9221 Personal history of antineoplastic chemotherapy: Secondary | ICD-10-CM

## 2018-05-15 NOTE — Progress Notes (Signed)
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  Assessment and Plan:  1.  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 Treated with neoadjuvant chemo with dose-dense Adriamycin/Cytoxan x 4 cycles, followed by Taxol x 2 cycles. Taxol was subsequently discontinued d/t reaction and Taxol was switched to Abraxane for remaining cycles-she completed chemotherapy on 10/28/15. She went on to have right breast lumpectomy, followed by adjuvant radiation therapy in [place] (dates unavailable).  -Adjuvant anti-estrogen therapy not recommended given triple negative disease.  She was offered genetic counseling in the past, but there was passive refusal in 2018 (patient never returned call about her decision to pursue genetic testing).   She was referred by NP Renato Battles for genetic counseling.    -Pt had bilateral diagnostic mammogram 08/09/17 negative for recurrent disease; she will be due for repeat annual diagnostic mammogram in 07/2018.  She presents today complaining of right breast pain and palpable lump in upper outer quadrant of right breast.  No dominant masses palpable.  Pt will be set up for right diagnostic mammogram due to concerns and will be notified of results.  She will be seen in 07/2018 for follow-up after scheduled mammogram performed likely of left breast.    2.  Psoriasis.  Pt should follow-up with Rheumatology or PCP as recommended.    25 minutes spent with more than 50% spent in counseling and coordination of care.    Current Status:  Pt seen today as a work-in after she reported right breast mass.      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 - DUCTAL CARCINOMA IN SITU. - SEE  COMMENT. Microscopic Comment The carcinoma appears grade 3. Per outside report, the tumor cells are negative for estrogen receptor, progesterone receptor and Her 2 neu. Ki-67 is 86%. (JBK:ds 06/17/15) JOSHUA KISH MD    06/20/2015 Imaging    MRI breast Right breast: In the upper-outer quadrant posterior 1/3 depth right breast, there is a 2.1 x 1.3 x 1.4 cm spiculated enhancing mass with washout enhancement kinetics and associated biopsy clip consistent with patient's known cancer.    06/30/2015 - 08/19/2015 Chemotherapy    Dose Dense AC x 4     09/02/2015 - 10/28/2015 Chemotherapy    Dose Dense Taxol X 2, reaction, then changed to abraxane     11/03/2015 Imaging    MRI breast Complete imaging response to neoadjuvant chemotherapy. No mass or abnormal enhancement is seen today.    12/12/2015 Surgery    RIGHT BREAST LUMPECTOMY WITH RADIOACTIVE SEED AND SENTINEL LYMPH NODE BIOPSY REMOVAL PORT-A-CATH, Dr. Excell Seltzer    12/12/2015 Pathology Results    Breast, lumpectomy, Right - FIBROSIS, HEMOSIDERIN DEPOSITION AND FOCAL GIANT CELL REACTION. - NO RESIDUAL TUMOR. - MARGINS NOT INVOLVED. 2. Lymph node, sentinel, biopsy, Right axillary #1 - ONE BENIGN LYMPH NODE (0/1). 3. Lymph node, sentinel, biopsy, Right axillary #2 - ONE BENIGN LYMPH NODE (0/1). 4. Lymph node, sentinel, biopsy, Right axillary #3 - ONE BENIGN LYMPH NODE (0/1). 5. Lymph node, sentinel, biopsy, Right axillary #4 - ONE BENIGN LYMPH NODE (0/1).    08/03/2016 Mammogram    IMPRESSION: Lumpectomy and radiation changes of the right breast. No evidence of malignancy in either breast.  Problem List Patient Active Problem List   Diagnosis Date Noted  . 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 Achilles tendon pain   . Anemia   . Arthritis    knees  . Breast cancer ([place])   . Cancer ([place])    right breast-already  finished chemo  . Cataracts, bilateral   . GERD (gastroesophageal reflux disease)   . Glaucoma   . Hypothyroidism   . Neuropathy    FROM CHEMO     . Psoriasis    elbows, knees  . Psoriasis    bil legs, elbows and hands  . TFCC (triangular fibrocartilage complex) tear    left    Past Surgical History Past Surgical History:  Procedure Laterality Date  . ABDOMINAL HYSTERECTOMY     partial  . BREAST LUMPECTOMY WITH RADIOACTIVE SEED AND SENTINEL LYMPH NODE BIOPSY Right 12/12/2015   Procedure: RIGHT BREAST LUMPECTOMY WITH RADIOACTIVE SEED AND SENTINEL LYMPH NODE BIOPSY;  Surgeon: Excell Seltzer, MD;  Location: [facility] [facility];  Service: General;  Laterality: Right;  . BREAST SURGERY    . BUNIONECTOMY Left   . CHOLECYSTECTOMY  09/2010  . CHOLECYSTECTOMY    . CYST EXCISION Left    wrist  . DILATION AND CURETTAGE OF UTERUS    . FOOT SURGERY  2004   left  . HAND TENDON SURGERY Left 2012  . KNEE ARTHROSCOPY Left    x2  . KNEE SURGERY  2000, 2008   left  . PARTIAL HYSTERECTOMY  1989  . PORT-A-CATH REMOVAL Left 12/12/2015   Procedure: REMOVAL PORT-A-CATH;  Surgeon: Excell Seltzer, MD;  Location: [facility];  Service: General;  Laterality: Left;  . PORTACATH PLACEMENT Left 06/27/2015   Procedure: INSERTION PORT-A-CATH;  Surgeon: Excell Seltzer, MD;  Location: [facility];  Service: General;  Laterality: Left;  . PTOSIS REPAIR Bilateral 10/21/2016   Procedure: INTERNAL PTOSIS REPAIR;  Surgeon: Clista Bernhardt, MD;  Location: [facility];  Service: Plastics;  Laterality: Bilateral;  . WRIST ARTHROSCOPY  06/29/2011   Procedure: ARTHROSCOPY WRIST;  Surgeon: Tennis Must;  Location: [facility];  Service: Orthopedics;  Laterality: Left;  left wrist tfcc repair    Family History Family History  Problem Relation Age of Onset  . COPD Mother   . Emphysema Mother   . Diabetes Father   . Hypertension Father   . Stroke Father   .  Kidney disease Sister        Stage III  . Thyroid disease Sister      Social History  reports that she has never smoked. She has never used smokeless tobacco. She reports that she does not drink alcohol or use drugs.  Medications  Current Outpatient Medications:  .  acetaminophen (TYLENOL) 500 MG tablet, Take 1,000 mg by mouth 2 (two) times daily as needed for moderate pain or headache. , Disp: , Rfl:  .  cyanocobalamin 2000 MCG tablet, Take 2,000 mcg by mouth daily., Disp: , Rfl:  .  Dietary Management Product (XYZBAC) TABS, TAKE 1 TABLET BY MOUTH EVERY DAY - use restat coupon, Disp: , Rfl: 3 .  levothyroxine (SYNTHROID, LEVOTHROID) 88 MCG tablet, TAKE 1 TABLET BY MOUTH ON AN EMPTY STOMACH IN THE MORNING DAILY, Disp: , Rfl: 4 .  loperamide (IMODIUM A-D) 2 MG tablet, Take 2 mg by mouth as needed for diarrhea or loose  stools., Disp: , Rfl:  .  omeprazole (PRILOSEC) 40 MG capsule, Take 40 mg by mouth daily., Disp: , Rfl: 4 .  Travoprost, BAK Free, (TRAVATAN) 0.004 % SOLN ophthalmic solution, Place 1 drop into both eyes at bedtime., Disp: , Rfl:  .  Vitamin D, Ergocalciferol, (DRISDOL) 50000 units CAPS capsule, Take 50,000 Units by mouth every Monday. , Disp: , Rfl: 5 .  gabapentin (NEURONTIN) 300 MG capsule, gabapentin 300 mg caps  Three times a day, Disp: , Rfl:  .  promethazine (PHENERGAN) 12.5 MG tablet, Take 1 tablet (12.5 mg total) by mouth every 6 (six) hours as needed for nausea or vomiting. (Patient not taking: Reported on 05/15/2018), Disp: 8 tablet, Rfl: 0  Allergies Other; Paclitaxel; Adhesive [tape]; Amoxicillin; Codeine; Hydrocodone; and Meperidine  Review of Systems Review of Systems - Oncology ROS Right breast mass and pain   Physical Exam  Vitals Wt Readings from Last 3 Encounters:  05/15/18 242 lb 6.4 oz (110 kg)  10/18/17 242 lb 1.6 oz (109.8 kg)  04/20/17 236 lb 1.6 oz (107.1 kg)   Temp Readings from Last 3 Encounters:  05/15/18 98 F (36.7 C) (Oral)   10/18/17 (!) 97.5 F (36.4 C) (Oral)  04/20/17 98 F (36.7 C) (Oral)   BP Readings from Last 3 Encounters:  05/15/18 128/68  10/18/17 127/74  04/20/17 136/68   Pulse Readings from Last 3 Encounters:  05/15/18 75  10/18/17 76  04/20/17 69    Constitutional: Well-developed, well-nourished, and in no distress.   HENT: Head: Normocephalic and atraumatic.  Mouth/Throat: No oropharyngeal exudate. Mucosa moist. Eyes: Pupils are equal, round, and reactive to light. Conjunctivae are normal. No scleral icterus.  Neck: Normal range of motion. Neck supple. No JVD present.  Cardiovascular: Normal rate, regular rhythm and normal heart sounds.  Exam reveals no gallop and no friction rub.   No murmur heard. Pulmonary/Chest: Effort normal and breath sounds normal. No respiratory distress. No wheezes.No rales.  Abdominal: Soft. Bowel sounds are normal. No distension. There is no tenderness. There is no guarding.  Musculoskeletal: No edema or tenderness.  Lymphadenopathy:No cervical, axillary or supraclavicular adenopathy.  Neurological: Alert and oriented to person, place, and time. No cranial nerve deficit.  Skin: Skin is warm and dry.  Psoriasis changes noted on hand. Psychiatric: Affect and judgment normal.  Breast exam:  Chaperone present.  Post-surgical changes noted in right breast.  No dominant masses palpable bilaterally.    Labs No visits with results within 3 Day(s) from this visit.  Latest known visit with results is:  Appointment on 10/18/2017  Component Date Value Ref Range Status  . WBC 10/18/2017 5.8  4.0 - 10.5 K/uL Final  . RBC 10/18/2017 4.21  3.87 - 5.11 MIL/uL Final  . Hemoglobin 10/18/2017 13.4  12.0 - 15.0 g/dL Final  . HCT 10/18/2017 40.5  36.0 - 46.0 % Final  . MCV 10/18/2017 96.2  78.0 - 100.0 fL Final  . MCH 10/18/2017 31.8  26.0 - 34.0 pg Final  . MCHC 10/18/2017 33.1  30.0 - 36.0 g/dL Final  . RDW 10/18/2017 12.9  11.5 - 15.5 % Final  . Platelets 10/18/2017  258  150 - 400 K/uL Final  . Neutrophils Relative % 10/18/2017 61  % Final  . Neutro Abs 10/18/2017 3.6  1.7 - 7.7 K/uL Final  . Lymphocytes Relative 10/18/2017 27  % Final  . Lymphs Abs 10/18/2017 1.6  0.7 - 4.0 K/uL Final  . Monocytes Relative 10/18/2017 7  %  Final  . Monocytes Absolute 10/18/2017 0.4  0.1 - 1.0 K/uL Final  . Eosinophils Relative 10/18/2017 4  % Final  . Eosinophils Absolute 10/18/2017 0.2  0.0 - 0.7 K/uL Final  . Basophils Relative 10/18/2017 1  % Final  . Basophils Absolute 10/18/2017 0.0  0.0 - 0.1 K/uL Final   Performed at [facility], [address] [place], [place] [address]  . Sodium 10/18/2017 139  135 - 145 mmol/L Final  . Potassium 10/18/2017 4.1  3.5 - 5.1 mmol/L Final  . Chloride 10/18/2017 105  101 - 111 mmol/L Final  . CO2 10/18/2017 24  22 - 32 mmol/L Final  . Glucose, Bld 10/18/2017 113* 65 - 99 mg/dL Final  . BUN 10/18/2017 20  6 - 20 mg/dL Final  . Creatinine, Ser 10/18/2017 0.82  0.44 - 1.00 mg/dL Final  . Calcium 10/18/2017 9.3  8.9 - 10.3 mg/dL Final  . Total Protein 10/18/2017 7.2  6.5 - 8.1 g/dL Final  . Albumin 10/18/2017 3.8  3.5 - 5.0 g/dL Final  . AST 10/18/2017 23  15 - 41 U/L Final  . ALT 10/18/2017 31  14 - 54 U/L Final  . Alkaline Phosphatase 10/18/2017 62  38 - 126 U/L Final  . Total Bilirubin 10/18/2017 0.8  0.3 - 1.2 mg/dL Final  . GFR calc non Af Amer 10/18/2017 >60  >60 mL/min Final  . GFR calc Af Amer 10/18/2017 >60  >60 mL/min Final   Comment: (NOTE) The eGFR has been calculated using the CKD EPI equation. This calculation has not been validated in all clinical situations. eGFR's persistently <60 mL/min signify possible Chronic Kidney Disease.   Georgiann Hahn gap 10/18/2017 10  5 - 15 Final   Performed at [facility], [address] [place], [place] [address]     Pathology Orders Placed This Encounter  Procedures  . MM Digital Diagnostic Unilat R    Standing Status:   Future    Standing Expiration Date:   05/15/2019     Order Specific Question:   Reason for Exam (SYMPTOM  OR DIAGNOSIS REQUIRED)    Answer:   right breast cancer.  Pt reports right  breast lump    Order Specific Question:   Preferred imaging location?    Answer:   [facility]    Standing Status:   Future    Standing Expiration Date:   05/15/2019    Order Specific Question:   Reason for Exam (SYMPTOM  OR DIAGNOSIS REQUIRED)    Answer:   right breast cancer    Order Specific Question:   Preferred imaging location?    Answer:   [facility]       Zoila Shutter MD

## 2018-05-30 ENCOUNTER — Other Ambulatory Visit (HOSPITAL_COMMUNITY): Payer: Self-pay | Admitting: Internal Medicine

## 2018-05-30 ENCOUNTER — Ambulatory Visit (HOSPITAL_COMMUNITY)
Admission: RE | Admit: 2018-05-30 | Discharge: 2018-05-30 | Disposition: A | Discharge status: Home / Self Care | Payer: BLUE CROSS/BLUE SHIELD | Source: Ambulatory Visit | Attending: Internal Medicine | Admitting: Internal Medicine

## 2018-05-30 ENCOUNTER — Ambulatory Visit (HOSPITAL_COMMUNITY)
Admission: RE | Admit: 2018-05-30 | Discharge: 2018-05-30 | Disposition: A | Discharge status: Home / Self Care | Payer: BLUE CROSS/BLUE SHIELD | Source: Ambulatory Visit | Attending: Adult Health | Admitting: Adult Health

## 2018-05-30 DIAGNOSIS — R928 Other abnormal and inconclusive findings on diagnostic imaging of breast: Secondary | ICD-10-CM | POA: Insufficient documentation

## 2018-05-30 DIAGNOSIS — C50919 Malignant neoplasm of unspecified site of unspecified female breast: Secondary | ICD-10-CM | POA: Diagnosis present

## 2018-08-02 ENCOUNTER — Encounter (HOSPITAL_COMMUNITY): Payer: Self-pay | Admitting: Lab

## 2018-08-02 ENCOUNTER — Other Ambulatory Visit (HOSPITAL_COMMUNITY): Payer: Self-pay | Admitting: Internal Medicine

## 2018-08-02 DIAGNOSIS — C50919 Malignant neoplasm of unspecified site of unspecified female breast: Secondary | ICD-10-CM

## 2018-08-14 ENCOUNTER — Other Ambulatory Visit (HOSPITAL_COMMUNITY): Payer: Self-pay

## 2018-08-14 ENCOUNTER — Other Ambulatory Visit (HOSPITAL_COMMUNITY): Payer: Self-pay | Admitting: Emergency Medicine

## 2018-08-14 DIAGNOSIS — C50919 Malignant neoplasm of unspecified site of unspecified female breast: Secondary | ICD-10-CM

## 2018-08-15 ENCOUNTER — Ambulatory Visit (HOSPITAL_COMMUNITY)
Admission: RE | Admit: 2018-08-15 | Discharge: 2018-08-15 | Disposition: A | Discharge status: Home / Self Care | Payer: BLUE CROSS/BLUE SHIELD | Source: Ambulatory Visit | Attending: Internal Medicine | Admitting: Internal Medicine

## 2018-08-15 ENCOUNTER — Ambulatory Visit (HOSPITAL_COMMUNITY): Payer: BLUE CROSS/BLUE SHIELD

## 2018-08-15 ENCOUNTER — Inpatient Hospital Stay (HOSPITAL_COMMUNITY): Payer: BLUE CROSS/BLUE SHIELD | Attending: Internal Medicine

## 2018-08-15 DIAGNOSIS — C50919 Malignant neoplasm of unspecified site of unspecified female breast: Secondary | ICD-10-CM

## 2018-08-15 DIAGNOSIS — Z853 Personal history of malignant neoplasm of breast: Secondary | ICD-10-CM | POA: Diagnosis present

## 2018-08-15 LAB — COMPREHENSIVE METABOLIC PANEL
ALK PHOS: 48 U/L (ref 38–126)
ALT: 40 U/L (ref 0–44)
ANION GAP: 10 (ref 5–15)
AST: 28 U/L (ref 15–41)
Albumin: 4.2 g/dL (ref 3.5–5.0)
BILIRUBIN TOTAL: 0.4 mg/dL (ref 0.3–1.2)
BUN: 22 mg/dL (ref 8–23)
CALCIUM: 10 mg/dL (ref 8.9–10.3)
CO2: 24 mmol/L (ref 22–32)
Chloride: 103 mmol/L (ref 98–111)
Creatinine, Ser: 0.85 mg/dL (ref 0.44–1.00)
GFR calc Af Amer: >60 mL/min (ref >60)
GFR calc non Af Amer: >60 mL/min (ref >60)
Glucose, Bld: 106 mg/dL — ABNORMAL HIGH (ref 70–99)
Potassium: 3.8 mmol/L (ref 3.5–5.1)
Sodium: 137 mmol/L (ref 135–145)
TOTAL PROTEIN: 7.5 g/dL (ref 6.5–8.1)

## 2018-08-15 LAB — CBC WITH DIFFERENTIAL/PLATELET
Abs Immature Granulocytes: 0.01 10*3/uL (ref 0.00–0.07)
BASOS ABS: 0.1 10*3/uL (ref 0.0–0.1)
Basophils Relative: 1 %
EOS ABS: 0.1 10*3/uL (ref 0.0–0.5)
EOS PCT: 2 %
HEMATOCRIT: 41.8 % (ref 36.0–46.0)
HEMOGLOBIN: 13.5 g/dL (ref 12.0–15.0)
Immature Granulocytes: 0 %
LYMPHS ABS: 2 10*3/uL (ref 0.7–4.0)
LYMPHS PCT: 29 %
MCH: 31.5 pg (ref 26.0–34.0)
MCHC: 32.3 g/dL (ref 30.0–36.0)
MCV: 97.4 fL (ref 80.0–100.0)
MONO ABS: 0.5 10*3/uL (ref 0.1–1.0)
MONOS PCT: 8 %
Neutro Abs: 4.1 10*3/uL (ref 1.7–7.7)
Neutrophils Relative %: 60 %
Platelets: 291 10*3/uL (ref 150–400)
RBC: 4.29 MIL/uL (ref 3.87–5.11)
RDW: 12.6 % (ref 11.5–15.5)
WBC: 6.8 10*3/uL (ref 4.0–10.5)
nRBC: 0 % (ref 0.0–0.2)

## 2018-08-16 MED ORDER — HEPARIN SOD (PORK) LOCK FLUSH 100 UNIT/ML IV SOLN
INTRAVENOUS | Status: AC
  Filled 2018-08-16: qty 5

## 2018-08-17 MED ORDER — EPOETIN ALFA 40000 UNIT/ML IJ SOLN
INTRAMUSCULAR | Status: AC
  Filled 2018-08-17: qty 1

## 2018-08-17 MED ORDER — PEGFILGRASTIM-CBQV 6 MG/0.6ML ~~LOC~~ SOSY
PREFILLED_SYRINGE | SUBCUTANEOUS | Status: AC
  Filled 2018-08-17: qty 0.6

## 2018-08-18 MED ORDER — OCTREOTIDE ACETATE 30 MG IM KIT
PACK | INTRAMUSCULAR | Status: AC
  Filled 2018-08-18: qty 1

## 2018-08-21 ENCOUNTER — Ambulatory Visit (HOSPITAL_COMMUNITY): Payer: Self-pay | Admitting: Hematology

## 2018-08-24 ENCOUNTER — Ambulatory Visit (HOSPITAL_COMMUNITY): Payer: Self-pay | Admitting: Hematology

## 2018-08-29 ENCOUNTER — Other Ambulatory Visit: Payer: Self-pay

## 2018-08-29 ENCOUNTER — Inpatient Hospital Stay (HOSPITAL_COMMUNITY): Payer: BLUE CROSS/BLUE SHIELD | Attending: Internal Medicine | Admitting: Hematology

## 2018-08-29 ENCOUNTER — Encounter (HOSPITAL_COMMUNITY): Payer: Self-pay | Admitting: Hematology

## 2018-08-29 VITALS — BP 142/64 | HR 80 | Temp 97.5°F | Resp 16 | Wt 242.0 lb

## 2018-08-29 DIAGNOSIS — G620 Drug-induced polyneuropathy: Secondary | ICD-10-CM

## 2018-08-29 DIAGNOSIS — Z853 Personal history of malignant neoplasm of breast: Secondary | ICD-10-CM

## 2018-08-29 DIAGNOSIS — C50919 Malignant neoplasm of unspecified site of unspecified female breast: Secondary | ICD-10-CM

## 2018-08-29 MED ORDER — ONDANSETRON HCL 4 MG/2ML IJ SOLN
INTRAMUSCULAR | Status: AC
  Filled 2018-08-29: qty 2

## 2018-08-29 MED ORDER — DULOXETINE HCL 30 MG PO CPEP: 60.0000 mg | ORAL_CAPSULE | Freq: Every day | ORAL | 2 refills | Status: DC

## 2018-08-29 NOTE — Progress Notes (Signed)
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 - DUCTAL CARCINOMA IN SITU. - SEE COMMENT. Microscopic Comment The carcinoma appears grade 3. Per outside report, the tumor cells are negative for estrogen receptor, progesterone receptor and Her 2 neu. Ki-67 is 86%. (JBK:ds 06/17/15) Betty KISH MD    06/20/2015 Imaging    MRI breast Right breast: In the upper-outer quadrant posterior 1/3 depth right breast, there is a 2.1 x 1.3 x 1.4 cm spiculated enhancing mass with washout enhancement kinetics and associated biopsy clip consistent with patient's known cancer.    06/30/2015 - 08/19/2015 Chemotherapy    Dose Dense AC x 4     09/02/2015 - 10/28/2015 Chemotherapy    Dose Dense Taxol X 2, reaction, then changed to abraxane     11/03/2015 Imaging    MRI breast Complete imaging response to neoadjuvant chemotherapy. No mass or abnormal enhancement is seen today.    12/12/2015 Surgery    RIGHT BREAST LUMPECTOMY WITH RADIOACTIVE SEED AND SENTINEL LYMPH NODE BIOPSY REMOVAL PORT-A-CATH, Dr. Excell Seltzer    12/12/2015 Pathology Results    Breast, lumpectomy, Right - FIBROSIS, HEMOSIDERIN DEPOSITION AND FOCAL GIANT CELL REACTION. - NO RESIDUAL TUMOR. - MARGINS NOT INVOLVED. 2. Lymph node, sentinel, biopsy, Right axillary #1 - ONE BENIGN LYMPH NODE (0/1). 3. Lymph node, sentinel, biopsy, Right axillary #2 - ONE BENIGN LYMPH NODE (0/1). 4. Lymph node, sentinel, biopsy, Right axillary #3 - ONE BENIGN LYMPH NODE (0/1). 5. Lymph node, sentinel, biopsy, Right  axillary #4 - ONE BENIGN LYMPH NODE (0/1).    08/03/2016 Mammogram    IMPRESSION: Lumpectomy and radiation changes of the right breast. No evidence of malignancy in either breast.      INTERVAL HISTORY:  Betty Henry 63 y.o. female returns for routine follow-up for triple negative invasive ductal carcinoma of right breast. She is here today with her husband. She still has some tenderness in her right breast at her incision site. She also has neuropathy in her feet and legs. She has mild numbness in her finger tips. Denies any nausea, vomiting, or diarrhea. Denies any new pains. Had not noticed any recent bleeding such as epistaxis, hematuria or hematochezia. Denies recent chest pain on exertion, shortness of breath on minimal exertion, pre-syncopal episodes, or palpitations. Denies any numbness or tingling in hands or feet. Denies any recent fevers, infections, or recent hospitalizations. Patient reports appetite at 100% and energy level at 50%. She has no problem eating or maintaining her weight.    REVIEW OF SYSTEMS:  Review of Systems  Constitutional: Positive for fatigue.  Neurological: Positive for numbness.  All other systems reviewed and are negative.    PAST MEDICAL/SURGICAL HISTORY:  Past Medical History:  Diagnosis Date  . Achilles tendon pain   . Anemia   . Arthritis    knees  . Breast cancer ([place])   . Cancer ([place])    right breast-already finished chemo  . Cataracts, bilateral   . GERD (gastroesophageal reflux disease)   . Glaucoma   . Hypothyroidism   . Neuropathy  FROM CHEMO     . Psoriasis    elbows, knees  . Psoriasis    bil legs, elbows and hands  . TFCC (triangular fibrocartilage complex) tear    left   Past Surgical History:  Procedure Laterality Date  . ABDOMINAL HYSTERECTOMY     partial  . BREAST LUMPECTOMY WITH RADIOACTIVE SEED AND SENTINEL LYMPH NODE BIOPSY Right 12/12/2015   Procedure: RIGHT BREAST LUMPECTOMY WITH RADIOACTIVE SEED AND SENTINEL  LYMPH NODE BIOPSY;  Surgeon: Excell Seltzer, MD;  Location: [facility];  Service: General;  Laterality: Right;  . BREAST SURGERY    . BUNIONECTOMY Left   . CHOLECYSTECTOMY  09/2010  . CHOLECYSTECTOMY    . CYST EXCISION Left    wrist  . DILATION AND CURETTAGE OF UTERUS    . FOOT SURGERY  2004   left  . HAND TENDON SURGERY Left 2012  . KNEE ARTHROSCOPY Left    x2  . KNEE SURGERY  2000, 2008   left  . PARTIAL HYSTERECTOMY  1989  . PORT-A-CATH REMOVAL Left 12/12/2015   Procedure: REMOVAL PORT-A-CATH;  Surgeon: Excell Seltzer, MD;  Location: [facility];  Service: General;  Laterality: Left;  . PORTACATH PLACEMENT Left 06/27/2015   Procedure: INSERTION PORT-A-CATH;  Surgeon: Excell Seltzer, MD;  Location: [facility];  Service: General;  Laterality: Left;  . PTOSIS REPAIR Bilateral 10/21/2016   Procedure: INTERNAL PTOSIS REPAIR;  Surgeon: Clista Bernhardt, MD;  Location: [facility];  Service: Plastics;  Laterality: Bilateral;  . WRIST ARTHROSCOPY  06/29/2011   Procedure: ARTHROSCOPY WRIST;  Surgeon: Tennis Must;  Location: [facility];  Service: Orthopedics;  Laterality: Left;  left wrist tfcc repair     SOCIAL HISTORY:  Social History   Socioeconomic History  . Marital status: Married    Spouse name: Not on file  . Number of children: Not on file  . Years of education: Not on file  . Highest education level: Not on file  Occupational History  . Not on file  Social Needs  . Financial resource strain: Not on file  . Food insecurity:    Worry: Not on file    Inability: Not on file  . Transportation needs:    Medical: Not on file    Non-medical: Not on file  Tobacco Use  . Smoking status: Never Smoker  . Smokeless tobacco: Never Used  Substance and Sexual Activity  . Alcohol use: No  . Drug use: No  . Sexual activity: Yes    Comment: married  Lifestyle  . Physical activity:    Days per week: Not on file     Minutes per session: Not on file  . Stress: Not on file  Relationships  . Social connections:    Talks on phone: Not on file    Gets together: Not on file    Attends religious service: Not on file    Active member of club or organization: Not on file    Attends meetings of clubs or organizations: Not on file    Relationship status: Not on file  . Intimate partner violence:    Fear of current or ex partner: Not on file    Emotionally abused: Not on file    Physically abused: Not on file    Forced sexual activity: Not on file  Other Topics Concern  . Not on file  Social History Narrative   ** Merged History Encounter **  FAMILY HISTORY:  Family History  Problem Relation Age of Onset  . COPD Mother   . Emphysema Mother   . Diabetes Father   . Hypertension Father   . Stroke Father   . Kidney disease Sister        Stage III  . Thyroid disease Sister     CURRENT MEDICATIONS:  Outpatient Encounter Medications as of 08/29/2018  Medication Sig  . acetaminophen (TYLENOL) 500 MG tablet Take 1,000 mg by mouth 2 (two) times daily as needed for moderate pain or headache.   . cyanocobalamin 2000 MCG tablet Take 2,000 mcg by mouth daily.  . Dietary Management Product (XYZBAC) TABS TAKE 1 TABLET BY MOUTH EVERY DAY - use restat coupon  . halobetasol (ULTRAVATE) 0.05 % cream   . levothyroxine (SYNTHROID, LEVOTHROID) 88 MCG tablet TAKE 1 TABLET BY MOUTH ON AN EMPTY STOMACH IN THE MORNING DAILY  . Travoprost, BAK Free, (TRAVATAN) 0.004 % SOLN ophthalmic solution Place 1 drop into both eyes at bedtime.  . Vitamin D, Ergocalciferol, (DRISDOL) 50000 units CAPS capsule Take 50,000 Units by mouth every Monday.   . [DISCONTINUED] omeprazole (PRILOSEC) 40 MG capsule Take 40 mg by mouth daily.  . DULoxetine (CYMBALTA) 30 MG capsule Take 2 capsules (60 mg total) by mouth at bedtime.  Marland Kitchen loperamide (IMODIUM A-D) 2 MG tablet Take 2 mg by mouth as needed for diarrhea or loose stools.  .  promethazine (PHENERGAN) 12.5 MG tablet Take 1 tablet (12.5 mg total) by mouth every 6 (six) hours as needed for nausea or vomiting. (Patient not taking: Reported on 05/15/2018)  . [DISCONTINUED] gabapentin (NEURONTIN) 300 MG capsule gabapentin 300 mg caps  Three times a day   No facility-administered encounter medications on file as of 08/29/2018.     ALLERGIES:  Allergies  Allergen Reactions  . Other Swelling    RAW GREEN PEPPERS THROAT SWELLING   . Paclitaxel Anaphylaxis    Due to cremaphor component most likely High fever  . Adhesive [Tape] Rash  . Amoxicillin Rash    "BAD RASH"  Has patient had a PCN reaction causing immediate rash, facial/tongue/throat swelling, SOB or lightheadedness with hypotension: #  #  #  YES  #  #  #  Has patient had a PCN reaction causing severe rash involving mucus membranes or skin necrosis: No Has patient had a PCN reaction that required hospitalization No Has patient had a PCN reaction occurring within the last 10 years: #  #  #  YES  #  #  #  If all of the above answers are "NO", then may proceed with Cephalosporin use.   . Codeine Nausea And Vomiting    Can take with nausea med  . Hydrocodone Nausea Only    States if she takes it she has to use phenergan also  . Meperidine Nausea And Vomiting     PHYSICAL EXAM:  ECOG Performance status: 1  Vitals:   08/29/18 0800  BP: (!) 142/64  Pulse: 80  Resp: 16  Temp: (!) 97.5 F (36.4 C)  SpO2: 94%   Filed Weights   08/29/18 0800  Weight: 242 lb (109.8 kg)    Physical Exam Constitutional:      Appearance: Normal appearance. She is normal weight.  Cardiovascular:     Rate and Rhythm: Normal rate and regular rhythm.     Heart sounds: Normal heart sounds.  Pulmonary:     Effort: Pulmonary effort is normal.     Breath sounds:  Normal breath sounds.  Abdominal:     General: Abdomen is flat.     Palpations: Abdomen is soft.  Musculoskeletal: Normal range of motion.  Skin:    General:  Skin is warm and dry.  Neurological:     Mental Status: She is alert and oriented to person, place, and time. Mental status is at baseline.  Psychiatric:        Mood and Affect: Mood normal.        Behavior: Behavior normal.        Thought Content: Thought content normal.        Judgment: Judgment normal.   Breast: No palpable masses, no skin changes or nipple discharge, no adenopathy.   LABORATORY DATA:  I have reviewed the labs as listed.  CBC    Component Value Date/Time   WBC 6.8 08/15/2018 1506   RBC 4.29 08/15/2018 1506   HGB 13.5 08/15/2018 1506   HCT 41.8 08/15/2018 1506   PLT 291 08/15/2018 1506   MCV 97.4 08/15/2018 1506   MCH 31.5 08/15/2018 1506   MCHC 32.3 08/15/2018 1506   RDW 12.6 08/15/2018 1506   LYMPHSABS 2.0 08/15/2018 1506   MONOABS 0.5 08/15/2018 1506   EOSABS 0.1 08/15/2018 1506   BASOSABS 0.1 08/15/2018 1506   CMP Latest Ref Rng & Units 08/15/2018 10/18/2017 04/20/2017  Glucose 70 - 99 mg/dL 106(H) 113(H) 107(H)  BUN 8 - 23 mg/dL 22 20 15   Creatinine 0.44 - 1.00 mg/dL 0.85 0.82 0.80  Sodium 135 - 145 mmol/L 137 139 139  Potassium 3.5 - 5.1 mmol/L 3.8 4.1 4.1  Chloride 98 - 111 mmol/L 103 105 108  CO2 22 - 32 mmol/L 24 24 25   Calcium 8.9 - 10.3 mg/dL 10.0 9.3 9.3  Total Protein 6.5 - 8.1 g/dL 7.5 7.2 7.2  Total Bilirubin 0.3 - 1.2 mg/dL 0.4 0.8 0.4  Alkaline Phos 38 - 126 U/L 48 62 63  AST 15 - 41 U/L 28 23 25   ALT 0 - 44 U/L 40 31 32       DIAGNOSTIC IMAGING:  I have independently reviewed the scans and discussed with the patient.   I have reviewed Francene Finders, NP's note and agree with the documentation.  I personally performed a face-to-face visit, made revisions and my assessment and plan is as follows.    ASSESSMENT & PLAN:   Triple negative malignant neoplasm of breast ([place]) 1.  Stage IIa right breast [place] BC: -Diagnosed on 05/21/2015, grade 3 invasive ductal carcinoma, ER/PR/HER-2 negative, Ki 67 of 86%. -Neoadjuvant chemotherapy  with AC x4 followed by Taxol x2 cycles.  She developed reaction to Taxol and received Abraxane instead and completed on 10/28/2015. -Right lumpectomy on 12/12/2015, with complete pathological response, YPT0, ypN0 - Today's physical examination did not reveal any suspicious masses.  Right lumpectomy in the upper outer quadrant is well-healed. - I have reviewed mammogram dated 08/15/2018 which was BI-RADS 2.  We have also reviewed her blood work. -I have strongly counseled and recommended genetic testing.  We will arrange a consultation with Roma Kayser. -I will see her back in 6 months for follow-up.  She was advised to take calcium and vitamin D supplements.  2.  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  She does have occasional pins-and-needles sensation in the feet.  She cannot stand for more than an hour. -She has tried gabapentin which has caused her drowsiness. -I talked to her about  starting her on Cymbalta at 30 mg daily with the intention of increasing it to 60 mg daily if it helps.  We talked about side effects in detail.      Orders placed this encounter:  Orders Placed This Encounter  Procedures  . CBC with Differential/Platelet  . Comprehensive metabolic panel      Derek Jack, MD [facility] [phone]

## 2018-08-29 NOTE — Assessment & Plan Note (Signed)
1.  Stage IIa right breast [place] BC: -Diagnosed on 05/21/2015, grade 3 invasive ductal carcinoma, ER/PR/HER-2 negative, Ki 67 of 86%. -Neoadjuvant chemotherapy with AC x4 followed by Taxol x2 cycles.  She developed reaction to Taxol and received Abraxane instead and completed on 10/28/2015. -Right lumpectomy on 12/12/2015, with complete pathological response, YPT0, ypN0 - Today's physical examination did not reveal any suspicious masses.  Right lumpectomy in the upper outer quadrant is well-healed. - I have reviewed mammogram dated 08/15/2018 which was BI-RADS 2.  We have also reviewed her blood work. -I have strongly counseled and recommended genetic testing.  We will arrange a consultation with Roma Kayser. -I will see her back in 6 months for follow-up.  She was advised to take calcium and vitamin D supplements.  2.  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  She does have occasional pins-and-needles sensation in the feet.  She cannot stand for more than an hour. -She has tried gabapentin which has caused her drowsiness. -I talked to her about starting her on Cymbalta at 30 mg daily with the intention of increasing it to 60 mg daily if it helps.  We talked about side effects in detail.

## 2018-08-29 NOTE — Patient Instructions (Signed)
[facility] Discharge Instructions     Thank you for choosing [facility] to provide your oncology and hematology care.  To afford each patient quality time with our provider, please arrive at least 15 minutes before your scheduled appointment time.   If you have a lab appointment with the Cancer Center please come in thru the  Main Entrance and check in at the main information desk  You need to re-schedule your appointment should you arrive 10 or more minutes late.  We strive to give you quality time with our providers, and arriving late affects you and other patients whose appointments are after yours.  Also, if you no show three or more times for appointments you may be dismissed from the clinic at the providers discretion.     Again, thank you for choosing [facility].  Our hope is that these requests will decrease the amount of time that you wait before being seen by our physicians.       _____________________________________________________________  Should you have questions after your visit to [facility], please contact our office at [phone] between the hours of 8:00 a.m. and 4:30 p.m.  Voicemails left after 4:00 p.m. will not be returned until the following business day.  For prescription refill requests, have your pharmacy contact our office and allow 72 hours.    Cancer Center Support Programs:   > Cancer Support Group  2nd Tuesday of the month 1pm-2pm, Journey Room    

## 2018-09-06 ENCOUNTER — Ambulatory Visit (INDEPENDENT_AMBULATORY_CARE_PROVIDER_SITE_OTHER): Payer: BLUE CROSS/BLUE SHIELD

## 2018-09-06 ENCOUNTER — Encounter (INDEPENDENT_AMBULATORY_CARE_PROVIDER_SITE_OTHER): Payer: Self-pay | Admitting: Family

## 2018-09-06 ENCOUNTER — Ambulatory Visit (INDEPENDENT_AMBULATORY_CARE_PROVIDER_SITE_OTHER): Payer: BLUE CROSS/BLUE SHIELD | Admitting: Family

## 2018-09-06 VITALS — Ht 61.0 in | Wt 242.0 lb

## 2018-09-06 DIAGNOSIS — G8929 Other chronic pain: Secondary | ICD-10-CM

## 2018-09-06 DIAGNOSIS — M25562 Pain in left knee: Principal | ICD-10-CM

## 2018-09-20 ENCOUNTER — Encounter (INDEPENDENT_AMBULATORY_CARE_PROVIDER_SITE_OTHER): Payer: Self-pay | Admitting: Family

## 2018-09-20 DIAGNOSIS — G8929 Other chronic pain: Secondary | ICD-10-CM | POA: Diagnosis not present

## 2018-09-20 DIAGNOSIS — M25562 Pain in left knee: Secondary | ICD-10-CM | POA: Diagnosis not present

## 2018-09-20 MED ORDER — LIDOCAINE HCL 1 % IJ SOLN
5.0000 mL | INTRAMUSCULAR | Status: AC | PRN
  Administered 2018-09-20: 5 mL

## 2018-09-20 MED ORDER — METHYLPREDNISOLONE ACETATE 40 MG/ML IJ SUSP
40.0000 mg | INTRAMUSCULAR | Status: AC | PRN
  Administered 2018-09-20: 40 mg via INTRA_ARTICULAR

## 2018-09-20 NOTE — Progress Notes (Signed)
Office Visit Note   Patient: Betty Henry           Date of Birth: 09-Jun-1956           MRN: [id number] Visit Date: 09/06/2018              Requested by: Assunta Curtis, [address] [place] [address] PCP: Assunta Curtis, FNP  Chief Complaint  Patient presents with  . Left Knee - Pain      HPI: The patient is a 63 year old woman who presents complaining of chronic left knee pain.  This is been going on for months to years.  Waxing and waning.  Last week had acute worsening after she attempted to stand up from her couch.  Felt she could not fully extend her knee.  Pain with flexion.  Having medial joint line pain.  Increased swelling.  Does complain of some start up stiffness with locking and giving way.  Does have a history of 2 previous arthroscopies to the same knee.  Assessment & Plan: Visit Diagnoses:  1. Chronic pain of left knee     Plan: Offered Depo-Medrol injection today.  Patient tolerated well.  Follow-up in office in 4 weeks as needed.  Follow-Up Instructions: No follow-ups on file.   Left Knee Exam   Muscle Strength  The patient has normal left knee strength.  Tenderness  The patient is experiencing tenderness in the medial joint line.  Range of Motion  The patient has normal left knee ROM.  Tests  Varus: negative Valgus: negative  Other  Erythema: absent Effusion: no effusion present      Patient is alert, oriented, no adenopathy, well-dressed, normal affect, normal respiratory effort.   Imaging: No results found. No images are attached to the encounter.  Labs: No results found for: HGBA1C, ESRSEDRATE, CRP, LABURIC, REPTSTATUS, GRAMSTAIN, CULT, LABORGA   Lab Results  Component Value Date   ALBUMIN 4.2 08/15/2018   ALBUMIN 3.8 10/18/2017   ALBUMIN 4.0 04/20/2017    Body mass index is 45.73 kg/m.  Orders:  Orders Placed This Encounter  Procedures  . XR Knee 1-2 Views Left   No orders of the defined  types were placed in this encounter.    Procedures: Large Joint Inj: L knee on 09/20/2018 1:57 PM Indications: pain Details: 18 G 1.5 in needle, anteromedial approach Medications: 5 mL lidocaine 1 %; 40 mg methylPREDNISolone acetate 40 MG/ML Consent was given by the patient.      Clinical Data: No additional findings.  ROS:  All other systems negative, except as noted in the HPI. Review of Systems  Constitutional: Negative for chills and fever.  Musculoskeletal: Positive for arthralgias. Negative for joint swelling.    Objective: Vital Signs: Ht 5\' 1"  (1.549 m)   Wt 242 lb (109.8 kg)   BMI 45.73 kg/m   Specialty Comments:  No specialty comments available.  PMFS History: Patient Active Problem List   Diagnosis Date Noted  . Triple negative malignant neoplasm of breast ([place]) 04/18/2016   Past Medical History:  Diagnosis Date  . Achilles tendon pain   . Anemia   . Arthritis    knees  . Breast cancer ([place])   . Cancer ([place])    right breast-already finished chemo  . Cataracts, bilateral   . GERD (gastroesophageal reflux disease)   . Glaucoma   . Hypothyroidism   . Neuropathy    FROM CHEMO     . Psoriasis  elbows, knees  . Psoriasis    bil legs, elbows and hands  . TFCC (triangular fibrocartilage complex) tear    left    Family History  Problem Relation Age of Onset  . COPD Mother   . Emphysema Mother   . Diabetes Father   . Hypertension Father   . Stroke Father   . Kidney disease Sister        Stage III  . Thyroid disease Sister     Past Surgical History:  Procedure Laterality Date  . ABDOMINAL HYSTERECTOMY     partial  . BREAST LUMPECTOMY WITH RADIOACTIVE SEED AND SENTINEL LYMPH NODE BIOPSY Right 12/12/2015   Procedure: RIGHT BREAST LUMPECTOMY WITH RADIOACTIVE SEED AND SENTINEL LYMPH NODE BIOPSY;  Surgeon: Excell Seltzer, MD;  Location: [facility];  Service: General;  Laterality: Right;  . BREAST SURGERY    . BUNIONECTOMY Left    . CHOLECYSTECTOMY  09/2010  . CHOLECYSTECTOMY    . CYST EXCISION Left    wrist  . DILATION AND CURETTAGE OF UTERUS    . FOOT SURGERY  2004   left  . HAND TENDON SURGERY Left 2012  . KNEE ARTHROSCOPY Left    x2  . KNEE SURGERY  2000, 2008   left  . PARTIAL HYSTERECTOMY  1989  . PORT-A-CATH REMOVAL Left 12/12/2015   Procedure: REMOVAL PORT-A-CATH;  Surgeon: Excell Seltzer, MD;  Location: [facility];  Service: General;  Laterality: Left;  . PORTACATH PLACEMENT Left 06/27/2015   Procedure: INSERTION PORT-A-CATH;  Surgeon: Excell Seltzer, MD;  Location: [facility];  Service: General;  Laterality: Left;  . PTOSIS REPAIR Bilateral 10/21/2016   Procedure: INTERNAL PTOSIS REPAIR;  Surgeon: Clista Bernhardt, MD;  Location: [facility];  Service: Plastics;  Laterality: Bilateral;  . WRIST ARTHROSCOPY  06/29/2011   Procedure: ARTHROSCOPY WRIST;  Surgeon: Tennis Must;  Location: [facility];  Service: Orthopedics;  Laterality: Left;  left wrist tfcc repair   Social History   Occupational History  . Not on file  Tobacco Use  . Smoking status: Never Smoker  . Smokeless tobacco: Never Used  Substance and Sexual Activity  . Alcohol use: No  . Drug use: No  . Sexual activity: Yes    Comment: married

## 2018-09-28 ENCOUNTER — Inpatient Hospital Stay (HOSPITAL_COMMUNITY): Payer: BLUE CROSS/BLUE SHIELD | Attending: Genetic Counselor | Admitting: Genetic Counselor

## 2018-09-28 ENCOUNTER — Other Ambulatory Visit: Payer: Self-pay

## 2018-09-28 ENCOUNTER — Inpatient Hospital Stay (HOSPITAL_COMMUNITY): Payer: BLUE CROSS/BLUE SHIELD

## 2018-09-28 ENCOUNTER — Encounter (HOSPITAL_COMMUNITY): Payer: Self-pay | Admitting: Genetic Counselor

## 2018-09-28 DIAGNOSIS — Z853 Personal history of malignant neoplasm of breast: Secondary | ICD-10-CM

## 2018-09-28 DIAGNOSIS — Z800 Family history of malignant neoplasm of digestive organs: Secondary | ICD-10-CM | POA: Diagnosis not present

## 2018-09-28 DIAGNOSIS — C50919 Malignant neoplasm of unspecified site of unspecified female breast: Secondary | ICD-10-CM

## 2018-09-28 DIAGNOSIS — Z8049 Family history of malignant neoplasm of other genital organs: Secondary | ICD-10-CM | POA: Diagnosis not present

## 2018-09-28 DIAGNOSIS — Z8052 Family history of malignant neoplasm of bladder: Secondary | ICD-10-CM | POA: Diagnosis not present

## 2018-09-28 NOTE — Progress Notes (Signed)
REFERRING PROVIDER: Derek Jack, [address] [place], [place] [address]  PRIMARY PROVIDER:  Assunta Curtis, FNP  PRIMARY REASON FOR VISIT:  1. 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 Family history of bladder cancer   3. Family history of stomach cancer      HISTORY OF PRESENT ILLNESS:   Betty Henry, a 63 y.o. female, was seen for a [place] cancer genetics consultation at the request of Dr. Delton Coombes due to a personal and family history of cancer.  Betty Henry presents to clinic today to discuss the possibility of a hereditary predisposition to cancer, genetic testing, and to further clarify her future cancer risks, as well as potential cancer risks for family members.   In 2016, at the age of 1, Betty Henry was diagnosed with triple negative cancer of the right breast. The treatment plan was chemotherapy, lumpectomy and radiation.     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 - DUCTAL CARCINOMA IN SITU. - SEE COMMENT. Microscopic Comment The carcinoma appears grade 3. Per outside report, the tumor cells are negative for estrogen receptor, progesterone receptor and Her 2 neu. Ki-67 is 86%. (JBK:ds 06/17/15) JOSHUA KISH MD    06/20/2015 Imaging    MRI breast Right breast: In the upper-outer quadrant posterior 1/3 depth right breast, there is a 2.1 x 1.3 x 1.4 cm spiculated enhancing mass with washout enhancement kinetics and associated biopsy clip consistent with patient's known cancer.    06/30/2015 - 08/19/2015 Chemotherapy    Dose Dense AC x 4     09/02/2015 - 10/28/2015 Chemotherapy    Dose Dense Taxol X 2, reaction, then changed to abraxane     11/03/2015 Imaging    MRI breast Complete imaging response to neoadjuvant chemotherapy. No mass or abnormal enhancement is seen today.    12/12/2015 Surgery    RIGHT BREAST LUMPECTOMY  WITH RADIOACTIVE SEED AND SENTINEL LYMPH NODE BIOPSY REMOVAL PORT-A-CATH, Dr. Excell Seltzer    12/12/2015 Pathology Results    Breast, lumpectomy, Right - FIBROSIS, HEMOSIDERIN DEPOSITION AND FOCAL GIANT CELL REACTION. - NO RESIDUAL TUMOR. - MARGINS NOT INVOLVED. 2. Lymph node, sentinel, biopsy, Right axillary #1 - ONE BENIGN LYMPH NODE (0/1). 3. Lymph node, sentinel, biopsy, Right axillary #2 - ONE BENIGN LYMPH NODE (0/1). 4. Lymph node, sentinel, biopsy, Right axillary #3 - ONE BENIGN LYMPH NODE (0/1). 5. Lymph node, sentinel, biopsy, Right axillary #4 - ONE BENIGN LYMPH NODE (0/1).    08/03/2016 Mammogram    IMPRESSION: Lumpectomy and radiation changes of the right breast. No evidence of malignancy in either breast.      RISK FACTORS:  Menarche was at age 68-14.  First live birth at age 8.  OCP use for approximately 10-15 years.  Ovaries intact: yes.  Hysterectomy: yes.  Menopausal status: postmenopausal.  HRT use: 0 years. Colonoscopy: yes; 3 polyps; screened every 5 years. Mammogram within the last year: yes. Number of breast biopsies: 1. Up to date with pelvic exams: no. Any excessive radiation exposure in the past: no  Past Medical History:  Diagnosis Date  . Achilles tendon pain   . Anemia   . Arthritis    knees  . Breast cancer ([place])   . Cancer ([place])    right breast-already finished chemo  . Cataracts, bilateral   . Family history of bladder cancer   .  Family history of stomach cancer   . GERD (gastroesophageal reflux disease)   . Glaucoma   . Hypothyroidism   . Neuropathy    FROM CHEMO     . Psoriasis    elbows, knees  . Psoriasis    bil legs, elbows and hands  . TFCC (triangular fibrocartilage complex) tear    left    Past Surgical History:  Procedure Laterality Date  . ABDOMINAL HYSTERECTOMY     partial  . BREAST LUMPECTOMY WITH RADIOACTIVE SEED AND SENTINEL LYMPH NODE BIOPSY Right 12/12/2015   Procedure: RIGHT BREAST LUMPECTOMY WITH  RADIOACTIVE SEED AND SENTINEL LYMPH NODE BIOPSY;  Surgeon: Excell Seltzer, MD;  Location: [facility];  Service: General;  Laterality: Right;  . BREAST SURGERY    . BUNIONECTOMY Left   . CHOLECYSTECTOMY  09/2010  . CHOLECYSTECTOMY    . CYST EXCISION Left    wrist  . DILATION AND CURETTAGE OF UTERUS    . FOOT SURGERY  2004   left  . HAND TENDON SURGERY Left 2012  . KNEE ARTHROSCOPY Left    x2  . KNEE SURGERY  2000, 2008   left  . PARTIAL HYSTERECTOMY  1989  . PORT-A-CATH REMOVAL Left 12/12/2015   Procedure: REMOVAL PORT-A-CATH;  Surgeon: Excell Seltzer, MD;  Location: [facility];  Service: General;  Laterality: Left;  . PORTACATH PLACEMENT Left 06/27/2015   Procedure: INSERTION PORT-A-CATH;  Surgeon: Excell Seltzer, MD;  Location: [facility];  Service: General;  Laterality: Left;  . PTOSIS REPAIR Bilateral 10/21/2016   Procedure: INTERNAL PTOSIS REPAIR;  Surgeon: Clista Bernhardt, MD;  Location: [facility];  Service: Plastics;  Laterality: Bilateral;  . WRIST ARTHROSCOPY  06/29/2011   Procedure: ARTHROSCOPY WRIST;  Surgeon: Tennis Must;  Location: [facility];  Service: Orthopedics;  Laterality: Left;  left wrist tfcc repair    Social History   Socioeconomic History  . Marital status: Married    Spouse name: Not on file  . Number of children: Not on file  . Years of education: Not on file  . Highest education level: Not on file  Occupational History  . Not on file  Social Needs  . Financial resource strain: Not on file  . Food insecurity:    Worry: Not on file    Inability: Not on file  . Transportation needs:    Medical: Not on file    Non-medical: Not on file  Tobacco Use  . Smoking status: Never Smoker  . Smokeless tobacco: Never Used  Substance and Sexual Activity  . Alcohol use: No  . Drug use: No  . Sexual activity: Yes    Comment: married  Lifestyle  . Physical activity:    Days per week: Not on  file    Minutes per session: Not on file  . Stress: Not on file  Relationships  . Social connections:    Talks on phone: Not on file    Gets together: Not on file    Attends religious service: Not on file    Active member of club or organization: Not on file    Attends meetings of clubs or organizations: Not on file    Relationship status: Not on file  Other Topics Concern  . Not on file  Social History Narrative   ** Merged History Encounter **         FAMILY HISTORY:  We obtained a detailed, 4-generation family history.  Significant  diagnoses are listed below: Family History  Problem Relation Age of Onset  . COPD Mother        d. 31  . Emphysema Mother   . Thyroid disease Mother   . Diabetes Father   . Hypertension Father   . Stroke Father        d. 20  . Kidney disease Sister        Stage III  . Thyroid disease Sister   . Cervical cancer Sister   . Cervical cancer Maternal Grandmother   . Heart attack Maternal Grandfather   . Stomach cancer Paternal Grandfather   . Bladder Cancer Paternal Aunt     The patient has one son who is cancer free.  She has two sisters, one who had cervical cancer.  Both parents are deceased.  The patient's mother died of COPD.  She had four sisters and three brothers who are cancer free. The maternal grandparents are deceased.  The grandmother had cervical cancer in her 30's-40's.  The patient's father died of a stroke at 20.  He had four sisters and four brothers.  One sister had bladder cancer.  His parents are deceased. His father died of stomach cancer.  Ms. Eichholz is unaware of previous family history of genetic testing for hereditary cancer risks. Patient's maternal ancestors are of Caucasian and [place] descent, and paternal ancestors are of Caucasian and [place] descent. There is no reported Ashkenazi Jewish ancestry. There is no known consanguinity.  GENETIC COUNSELING ASSESSMENT: Ms. Waldron is a 63 y.o. female with  a personal and family history of cancer which is somewhat suggestive of a hereditary cancer syndrome and predisposition to cancer. We, therefore, discussed and recommended the following at today's visit.   DISCUSSION: We discussed that 5 - 10% of breast is hereditary, with most cases associated with BRCA and PALB2 mutations.  There are other genes that can be associated with hereditary breast cancer syndromes.  These include ATM and CHEK2, although these are not typically causing triple negative breast cancer.  We discussed that the lack of breast cancer in her family lowers the likelihood that she has a hereditary cancer syndrome, specially breast cancer, but that her triple negative status allows her to meet the criteria.    We reviewed the characteristics, features and inheritance patterns of hereditary cancer syndromes. We also discussed genetic testing, including the appropriate family members to test, the process of testing, insurance coverage and turn-around-time for results. We discussed the implications of a negative, positive and/or variant of uncertain significant result. We recommended Ms. Lirette pursue genetic testing for the common hereditary gene panel. The Hereditary Gene Panel offered by [facility] includes sequencing and/or deletion duplication testing of the following 47 genes: APC, ATM, AXIN2, BARD1, BMPR1A, BRCA1, BRCA2, BRIP1, CDH1, CDK4, CDKN2A (p14ARF), CDKN2A (p16INK4a), CHEK2, CTNNA1, DICER1, EPCAM (Deletion/duplication testing only), GREM1 (promoter region deletion/duplication testing only), KIT, MEN1, MLH1, MSH2, MSH3, MSH6, MUTYH, NBN, NF1, NHTL1, PALB2, PDGFRA, PMS2, POLD1, POLE, PTEN, RAD50, RAD51C, RAD51D, SDHB, SDHC, SDHD, SMAD4, SMARCA4. STK11, TP53, TSC1, TSC2, and VHL.  The following genes were evaluated for sequence changes only: SDHA and HOXB13 c.251G>A variant only.   Based on Ms. Kanno's personal and family history of cancer, she meets medical criteria for genetic testing.  Despite that she meets criteria, she may still have an out of pocket cost. We discussed that if her out of pocket cost for testing is over $100, the laboratory will call and confirm whether she wants  to proceed with testing.  If the out of pocket cost of testing is less than $100 she will be billed by the genetic testing laboratory.   PLAN: After considering the risks, benefits, and limitations, Ms. Vento provided informed consent to pursue genetic testing and the blood sample was sent to [facility] for analysis of the common hereditary cancer panel. Results should be available within approximately 2-3 weeks' time, at which point they will be disclosed by telephone to Ms. Hollibaugh, as will any additional recommendations warranted by these results. Ms. Alcindor will receive a summary of her genetic counseling visit and a copy of her results once available. This information will also be available in Epic.   Lastly, we encouraged Ms. Burbach to remain in contact with cancer genetics annually so that we can continuously update the family history and inform her of any changes in cancer genetics and testing that may be of benefit for this family.   Ms. Pogosyan questions were answered to her satisfaction today. Our contact information was provided should additional questions or concerns arise. Thank you for the referral and allowing [place] to share in the care of your patient.   Elon P. Florene Glen, [place], [facility] Certified Genetic Counselor [email or web address] phone: [phone]  The patient was seen for a total of 45 minutes in face-to-face genetic counseling.  This patient was discussed with Drs. Magrinat, Lindi Adie and/or Burr Medico who agrees with the above.    _______________________________________________________________________ For Office Staff:  Number of people involved in session: 1 Was an Intern/ student involved with case: no

## 2018-09-29 MED ORDER — HEPARIN SOD (PORK) LOCK FLUSH 100 UNIT/ML IV SOLN
INTRAVENOUS | Status: AC
  Filled 2018-09-29: qty 5

## 2018-10-23 ENCOUNTER — Encounter: Payer: Self-pay | Admitting: Genetic Counselor

## 2018-10-23 ENCOUNTER — Telehealth: Payer: Self-pay | Admitting: Genetic Counselor

## 2018-10-23 DIAGNOSIS — Z1379 Encounter for other screening for genetic and chromosomal anomalies: Secondary | ICD-10-CM | POA: Insufficient documentation

## 2018-10-23 NOTE — Telephone Encounter (Signed)
LM on VM that results are back and to please CB. 

## 2018-10-23 NOTE — Telephone Encounter (Signed)
Revealed negative genetic testing.  Discussed that we do not know why she has breast cancer or why there is cancer in the family. It could be due to a different gene that we are not testing, or maybe our current technology may not be able to pick something up.  It will be important for her to keep in contact with genetics to keep up with whether additional testing may be needed. 

## 2018-10-25 ENCOUNTER — Ambulatory Visit: Payer: Self-pay | Admitting: Genetic Counselor

## 2018-10-25 DIAGNOSIS — Z1379 Encounter for other screening for genetic and chromosomal anomalies: Secondary | ICD-10-CM

## 2018-10-25 NOTE — Progress Notes (Signed)
HPI:  Ms. Busk was previously seen in the [place] clinic due to a personal and family history of cancer and concerns regarding a hereditary predisposition to cancer. Please refer to our prior cancer genetics clinic note for more information regarding our discussion, assessment and recommendations, at the time. Ms. Ouk recent genetic test results were disclosed to her, as were recommendations warranted by these results. These results and recommendations are discussed in more detail below.  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 - DUCTAL CARCINOMA IN SITU. - SEE COMMENT. Microscopic Comment The carcinoma appears grade 3. Per outside report, the tumor cells are negative for estrogen receptor, progesterone receptor and Her 2 neu. Ki-67 is 86%. (JBK:ds 06/17/15) JOSHUA KISH MD    06/20/2015 Imaging    MRI breast Right breast: In the upper-outer quadrant posterior 1/3 depth right breast, there is a 2.1 x 1.3 x 1.4 cm spiculated enhancing mass with washout enhancement kinetics and associated biopsy clip consistent with patient's known cancer.    06/30/2015 - 08/19/2015 Chemotherapy    Dose Dense AC x 4     09/02/2015 - 10/28/2015 Chemotherapy    Dose Dense Taxol X 2, reaction, then changed to abraxane     11/03/2015 Imaging    MRI breast Complete imaging response to neoadjuvant chemotherapy. No mass or abnormal enhancement is seen today.    12/12/2015 Surgery    RIGHT BREAST LUMPECTOMY WITH RADIOACTIVE SEED AND SENTINEL LYMPH NODE BIOPSY REMOVAL PORT-A-CATH, Dr. Excell Seltzer    12/12/2015 Pathology Results    Breast, lumpectomy, Right - FIBROSIS, HEMOSIDERIN DEPOSITION AND FOCAL GIANT CELL REACTION. - NO RESIDUAL TUMOR. - MARGINS NOT INVOLVED. 2. Lymph node, sentinel, biopsy, Right axillary #1 - ONE BENIGN LYMPH NODE (0/1). 3. Lymph node,  sentinel, biopsy, Right axillary #2 - ONE BENIGN LYMPH NODE (0/1). 4. Lymph node, sentinel, biopsy, Right axillary #3 - ONE BENIGN LYMPH NODE (0/1). 5. Lymph node, sentinel, biopsy, Right axillary #4 - ONE BENIGN LYMPH NODE (0/1).    08/03/2016 Mammogram    IMPRESSION: Lumpectomy and radiation changes of the right breast. No evidence of malignancy in either breast.    10/23/2018 Genetic Testing    Negative genetic testing on the common hereditary cancer panel.  The Hereditary Gene Panel offered by [facility] includes sequencing and/or deletion duplication testing of the following 48 genes: APC, ATM, AXIN2, BARD1, BMPR1A, BRCA1, BRCA2, BRIP1, CDH1, CDK4, CDKN2A (p14ARF), CDKN2A (p16INK4a), CHEK2, CTNNA1, DICER1, EPCAM (Deletion/duplication testing only), GREM1 (promoter region deletion/duplication testing only), KIT, MEN1, MLH1, MSH2, MSH3, MSH6, MUTYH, NBN, NF1, NHTL1, PALB2, PDGFRA, PMS2, POLD1, POLE, PTEN, RAD50, RAD51C, RAD51D, RNF43, SDHB, SDHC, SDHD, SMAD4, SMARCA4. STK11, TP53, TSC1, TSC2, and VHL.  The following genes were evaluated for sequence changes only: SDHA and HOXB13 c.251G>A variant only. The report date is October 23, 2018.     FAMILY HISTORY:  We obtained a detailed, 4-generation family history.  Significant diagnoses are listed below: Family History  Problem Relation Age of Onset  . COPD Mother        d. 33  . Emphysema Mother   . Thyroid disease Mother   . Diabetes Father   . Hypertension Father   . Stroke Father        d. 67  . Kidney disease Sister        Stage III  . Thyroid disease  Sister   . Cervical cancer Sister   . Cervical cancer Maternal Grandmother   . Heart attack Maternal Grandfather   . Stomach cancer Paternal Grandfather   . Bladder Cancer Paternal Aunt    The patient has one son who is cancer free.  She has two sisters, one who had cervical cancer.  Both parents are deceased.  The patient's mother died of COPD.  She had four sisters and three  brothers who are cancer free. The maternal grandparents are deceased.  The grandmother had cervical cancer in her 30's-40's.  The patient's father died of a stroke at 50.  He had four sisters and four brothers.  One sister had bladder cancer.  His parents are deceased. His father died of stomach cancer.  Ms. Chokshi is unaware of previous family history of genetic testing for hereditary cancer risks. Patient's maternal ancestors are of Caucasian and [place] descent, and paternal ancestors are of Caucasian and [place] descent. There is no reported Ashkenazi Jewish ancestry. There is no known consanguinity.     GENETIC TEST RESULTS: Genetic testing reported out on October 23, 2018 through the common hereditary cancer panel found no pathogenic mutations. The Hereditary Gene Panel offered by [facility] includes sequencing and/or deletion duplication testing of the following 48 genes: APC, ATM, AXIN2, BARD1, BMPR1A, BRCA1, BRCA2, BRIP1, CDH1, CDK4, CDKN2A (p14ARF), CDKN2A (p16INK4a), CHEK2, CTNNA1, DICER1, EPCAM (Deletion/duplication testing only), GREM1 (promoter region deletion/duplication testing only), KIT, MEN1, MLH1, MSH2, MSH3, MSH6, MUTYH, NBN, NF1, NHTL1, PALB2, PDGFRA, PMS2, POLD1, POLE, PTEN, RAD50, RAD51C, RAD51D, RNF43, SDHB, SDHC, SDHD, SMAD4, SMARCA4. STK11, TP53, TSC1, TSC2, and VHL.  The following genes were evaluated for sequence changes only: SDHA and HOXB13 c.251G>A variant only. The test report has been scanned into EPIC and is located under the Molecular Pathology section of the Results Review tab.  A portion of the result report is included below for reference.     We discussed with Ms. Steven that because current genetic testing is not perfect, it is possible there may be a gene mutation in one of these genes that current testing cannot detect, but that chance is small.  We also discussed, that there could be another gene that has not yet been discovered, or that we have not  yet tested, that is responsible for the cancer diagnoses in the family. It is also possible there is a hereditary cause for the cancer in the family that Ms. Portilla did not inherit and therefore was not identified in her testing.  Therefore, it is important to remain in touch with cancer genetics in the future so that we can continue to offer Ms. Hogland the most up to date genetic testing.   ADDITIONAL GENETIC TESTING: We discussed with Ms. Winsett that there are other genes that are associated with increased cancer risk that can be analyzed. Should Ms. Cawood wish to pursue additional genetic testing, we are happy to discuss and coordinate this testing, at any time.    CANCER SCREENING RECOMMENDATIONS: Ms. Drumgoole test result is considered negative (normal).  This means that we have not identified a hereditary cause for her personal and family history of cancer at this time. Most cancers happen by chance and this negative test suggests that her cancer may fall into this category.    While reassuring, this does not definitively rule out a hereditary predisposition to cancer. It is still possible that there could be genetic mutations that are undetectable by current technology. There could be  genetic mutations in genes that have not been tested or identified to increase cancer risk.  Therefore, it is recommended she continue to follow the cancer management and screening guidelines provided by her oncology and primary healthcare provider.   An individual's cancer risk and medical management are not determined by genetic test results alone. Overall cancer risk assessment incorporates additional factors, including personal medical history, family history, and any available genetic information that may result in a personalized plan for cancer prevention and surveillance  RECOMMENDATIONS FOR FAMILY MEMBERS:  Individuals in this family might be at some increased risk of developing cancer, over the general population  risk, simply due to the family history of cancer.  We recommended women in this family have a yearly mammogram beginning at age 72, or 57 years younger than the earliest onset of cancer, an annual clinical breast exam, and perform monthly breast self-exams. Women in this family should also have a gynecological exam as recommended by their primary provider. All family members should have a colonoscopy by age 53.  FOLLOW-UP: Lastly, we discussed with Ms. Centola that cancer genetics is a rapidly advancing field and it is possible that new genetic tests will be appropriate for her and/or her family members in the future. We encouraged her to remain in contact with cancer genetics on an annual basis so we can update her personal and family histories and let her know of advances in cancer genetics that may benefit this family.   Our contact number was provided. Ms. Bains questions were answered to her satisfaction, and she knows she is welcome to call us at anytime with additional questions or concerns.   Roma Kayser, MS, [facility] Certified Genetic Counselor [email or web address]

## 2019-02-20 ENCOUNTER — Inpatient Hospital Stay (HOSPITAL_COMMUNITY): Payer: BC Managed Care – PPO | Attending: Hematology

## 2019-02-20 ENCOUNTER — Other Ambulatory Visit: Payer: Self-pay

## 2019-02-20 DIAGNOSIS — E039 Hypothyroidism, unspecified: Secondary | ICD-10-CM | POA: Insufficient documentation

## 2019-02-20 DIAGNOSIS — C50911 Malignant neoplasm of unspecified site of right female breast: Secondary | ICD-10-CM | POA: Insufficient documentation

## 2019-02-20 DIAGNOSIS — G620 Drug-induced polyneuropathy: Secondary | ICD-10-CM | POA: Insufficient documentation

## 2019-02-20 DIAGNOSIS — Z171 Estrogen receptor negative status [ER-]: Secondary | ICD-10-CM | POA: Diagnosis not present

## 2019-02-20 DIAGNOSIS — T451X5S Adverse effect of antineoplastic and immunosuppressive drugs, sequela: Secondary | ICD-10-CM | POA: Insufficient documentation

## 2019-02-20 DIAGNOSIS — M1990 Unspecified osteoarthritis, unspecified site: Secondary | ICD-10-CM | POA: Insufficient documentation

## 2019-02-20 DIAGNOSIS — K219 Gastro-esophageal reflux disease without esophagitis: Secondary | ICD-10-CM | POA: Diagnosis not present

## 2019-02-20 DIAGNOSIS — L409 Psoriasis, unspecified: Secondary | ICD-10-CM | POA: Insufficient documentation

## 2019-02-20 DIAGNOSIS — Z9221 Personal history of antineoplastic chemotherapy: Secondary | ICD-10-CM | POA: Diagnosis not present

## 2019-02-20 DIAGNOSIS — Z79899 Other long term (current) drug therapy: Secondary | ICD-10-CM | POA: Insufficient documentation

## 2019-02-20 DIAGNOSIS — C50919 Malignant neoplasm of unspecified site of unspecified female breast: Secondary | ICD-10-CM

## 2019-02-20 LAB — COMPREHENSIVE METABOLIC PANEL
ALT: 29 U/L (ref 0–44)
AST: 21 U/L (ref 15–41)
Albumin: 4 g/dL (ref 3.5–5.0)
Alkaline Phosphatase: 51 U/L (ref 38–126)
Anion gap: 10 (ref 5–15)
BUN: 24 mg/dL — ABNORMAL HIGH (ref 8–23)
CO2: 22 mmol/L (ref 22–32)
Calcium: 9.3 mg/dL (ref 8.9–10.3)
Chloride: 107 mmol/L (ref 98–111)
Creatinine, Ser: 0.69 mg/dL (ref 0.44–1.00)
GFR calc Af Amer: >60 mL/min (ref >60)
GFR calc non Af Amer: >60 mL/min (ref >60)
Glucose, Bld: 110 mg/dL — ABNORMAL HIGH (ref 70–99)
Potassium: 4.3 mmol/L (ref 3.5–5.1)
Sodium: 139 mmol/L (ref 135–145)
Total Bilirubin: 0.6 mg/dL (ref 0.3–1.2)
Total Protein: 7.5 g/dL (ref 6.5–8.1)

## 2019-02-20 LAB — CBC WITH DIFFERENTIAL/PLATELET
Abs Immature Granulocytes: 0.01 10*3/uL (ref 0.00–0.07)
Basophils Absolute: 0.1 10*3/uL (ref 0.0–0.1)
Basophils Relative: 1 %
Eosinophils Absolute: 0.2 10*3/uL (ref 0.0–0.5)
Eosinophils Relative: 3 %
HCT: 42.5 % (ref 36.0–46.0)
Hemoglobin: 13.9 g/dL (ref 12.0–15.0)
Immature Granulocytes: 0 %
Lymphocytes Relative: 25 %
Lymphs Abs: 1.5 10*3/uL (ref 0.7–4.0)
MCH: 32.3 pg (ref 26.0–34.0)
MCHC: 32.7 g/dL (ref 30.0–36.0)
MCV: 98.6 fL (ref 80.0–100.0)
Monocytes Absolute: 0.4 10*3/uL (ref 0.1–1.0)
Monocytes Relative: 7 %
Neutro Abs: 3.9 10*3/uL (ref 1.7–7.7)
Neutrophils Relative %: 64 %
Platelets: 292 10*3/uL (ref 150–400)
RBC: 4.31 MIL/uL (ref 3.87–5.11)
RDW: 12.4 % (ref 11.5–15.5)
WBC: 6.1 10*3/uL (ref 4.0–10.5)
nRBC: 0 % (ref 0.0–0.2)

## 2019-02-27 ENCOUNTER — Encounter (HOSPITAL_COMMUNITY): Payer: Self-pay | Admitting: Hematology

## 2019-02-27 ENCOUNTER — Other Ambulatory Visit: Payer: Self-pay

## 2019-02-27 ENCOUNTER — Inpatient Hospital Stay (HOSPITAL_COMMUNITY): Payer: BC Managed Care – PPO | Admitting: Hematology

## 2019-02-27 VITALS — BP 144/62 | HR 67 | Temp 97.7°F | Resp 16 | Wt 239.6 lb

## 2019-02-27 DIAGNOSIS — G620 Drug-induced polyneuropathy: Secondary | ICD-10-CM

## 2019-02-27 DIAGNOSIS — T451X5A Adverse effect of antineoplastic and immunosuppressive drugs, initial encounter: Secondary | ICD-10-CM

## 2019-02-27 DIAGNOSIS — C50919 Malignant neoplasm of unspecified site of unspecified female breast: Secondary | ICD-10-CM

## 2019-02-27 DIAGNOSIS — Z780 Asymptomatic menopausal state: Secondary | ICD-10-CM

## 2019-02-27 DIAGNOSIS — C50911 Malignant neoplasm of unspecified site of right female breast: Secondary | ICD-10-CM | POA: Diagnosis not present

## 2019-02-27 MED ORDER — DULOXETINE HCL 30 MG PO CPEP: 30.0000 mg | ORAL_CAPSULE | Freq: Every day | ORAL | 1 refills | Status: DC

## 2019-02-27 NOTE — Progress Notes (Signed)
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  BRIEF ONCOLOGIC HISTORY:  Oncology History  Triple negative malignant neoplasm of breast ([place] [facility])  06/17/2015 Pathology Results   Consult Slide , right breast - INVASIVE DUCTAL CARCINOMA. - DUCTAL CARCINOMA IN SITU. - SEE COMMENT. Microscopic Comment The carcinoma appears grade 3. Per outside report, the tumor cells are negative for estrogen receptor, progesterone receptor and Her 2 neu. Ki-67 is 86%. (JBK:ds 06/17/15) JOSHUA KISH MD   06/20/2015 Imaging   MRI breast Right breast: In the upper-outer quadrant posterior 1/3 depth right breast, there is a 2.1 x 1.3 x 1.4 cm spiculated enhancing mass with washout enhancement kinetics and associated biopsy clip consistent with patient's known cancer.   06/30/2015 - 08/19/2015 Chemotherapy   Dose Dense AC x 4    09/02/2015 - 10/28/2015 Chemotherapy   Dose Dense Taxol X 2, reaction, then changed to abraxane    11/03/2015 Imaging   MRI breast Complete imaging response to neoadjuvant chemotherapy. No mass or abnormal enhancement is seen today.   12/12/2015 Surgery   RIGHT BREAST LUMPECTOMY WITH RADIOACTIVE SEED AND SENTINEL LYMPH NODE BIOPSY REMOVAL PORT-A-CATH, Dr. Excell Seltzer   12/12/2015 Pathology Results   Breast, lumpectomy, Right - FIBROSIS, HEMOSIDERIN DEPOSITION AND FOCAL GIANT CELL REACTION. - NO RESIDUAL TUMOR. - MARGINS NOT INVOLVED. 2. Lymph node, sentinel, biopsy, Right axillary #1 - ONE BENIGN LYMPH NODE (0/1). 3. Lymph node, sentinel, biopsy, Right axillary #2 - ONE BENIGN LYMPH NODE (0/1). 4. Lymph node, sentinel, biopsy, Right axillary #3 - ONE BENIGN LYMPH NODE (0/1). 5. Lymph  node, sentinel, biopsy, Right axillary #4 - ONE BENIGN LYMPH NODE (0/1).   08/03/2016 Mammogram   IMPRESSION: Lumpectomy and radiation changes of the right breast. No evidence of malignancy in either breast.   10/23/2018 Genetic Testing   Negative genetic testing on the common hereditary cancer panel.  The Hereditary Gene Panel offered by [facility] includes sequencing and/or deletion duplication testing of the following 48 genes: APC, ATM, AXIN2, BARD1, BMPR1A, BRCA1, BRCA2, BRIP1, CDH1, CDK4, CDKN2A (p14ARF), CDKN2A (p16INK4a), CHEK2, CTNNA1, DICER1, EPCAM (Deletion/duplication testing only), GREM1 (promoter region deletion/duplication testing only), KIT, MEN1, MLH1, MSH2, MSH3, MSH6, MUTYH, NBN, NF1, NHTL1, PALB2, PDGFRA, PMS2, POLD1, POLE, PTEN, RAD50, RAD51C, RAD51D, RNF43, SDHB, SDHC, SDHD, SMAD4, SMARCA4. STK11, TP53, TSC1, TSC2, and VHL.  The following genes were evaluated for sequence changes only: SDHA and HOXB13 c.251G>A variant only. The report date is October 23, 2018.      INTERVAL HISTORY:  Betty Henry 63 y.o. female seen for follow-up of triple negative breast cancer of the right breast.  Denies any new onset pains.  Continues to have pins-and-needles sensation in the feet.  She is not able to stand for more than a few minutes at a time.  She works at [employer].  She has tried gabapentin but cause drowsiness.  Appetite is 100%.  Pain in the feet is reported as 4/10.  Denies any nausea, vomiting, diarrhea or constipation.  No bleeding per rectum or melena.  REVIEW OF SYSTEMS:  Review of Systems  Constitutional: Negative for fatigue.  Neurological: Positive for numbness.  All other systems reviewed and are negative.    PAST MEDICAL/SURGICAL HISTORY:  Past  Medical History:  Diagnosis Date  . Achilles tendon pain   . Anemia   . Arthritis    knees  . Breast cancer ([facility])   . Cancer ([facility])    right breast-already finished chemo  . Cataracts, bilateral   . Family  history of bladder cancer   . Family history of stomach cancer   . GERD (gastroesophageal reflux disease)   . Glaucoma   . Hypothyroidism   . Neuropathy    FROM CHEMO     . Psoriasis    elbows, knees  . Psoriasis    bil legs, elbows and hands  . TFCC (triangular fibrocartilage complex) tear    left   Past Surgical History:  Procedure Laterality Date  . ABDOMINAL HYSTERECTOMY     partial  . BREAST LUMPECTOMY WITH RADIOACTIVE SEED AND SENTINEL LYMPH NODE BIOPSY Right 12/12/2015   Procedure: RIGHT BREAST LUMPECTOMY WITH RADIOACTIVE SEED AND SENTINEL LYMPH NODE BIOPSY;  Surgeon: Excell Seltzer, MD;  Location: [facility];  Service: General;  Laterality: Right;  . BREAST SURGERY    . BUNIONECTOMY Left   . CHOLECYSTECTOMY  09/2010  . CHOLECYSTECTOMY    . CYST EXCISION Left    wrist  . DILATION AND CURETTAGE OF UTERUS    . FOOT SURGERY  2004   left  . HAND TENDON SURGERY Left 2012  . KNEE ARTHROSCOPY Left    x2  . KNEE SURGERY  2000, 2008   left  . PARTIAL HYSTERECTOMY  1989  . PORT-A-CATH REMOVAL Left 12/12/2015   Procedure: REMOVAL PORT-A-CATH;  Surgeon: Excell Seltzer, MD;  Location: [facility];  Service: General;  Laterality: Left;  . PORTACATH PLACEMENT Left 06/27/2015   Procedure: INSERTION PORT-A-CATH;  Surgeon: Excell Seltzer, MD;  Location: [facility];  Service: General;  Laterality: Left;  . PTOSIS REPAIR Bilateral 10/21/2016   Procedure: INTERNAL PTOSIS REPAIR;  Surgeon: Clista Bernhardt, MD;  Location: [facility];  Service: Plastics;  Laterality: Bilateral;  . WRIST ARTHROSCOPY  06/29/2011   Procedure: ARTHROSCOPY WRIST;  Surgeon: Tennis Must;  Location: [facility];  Service: Orthopedics;  Laterality: Left;  left wrist tfcc repair     SOCIAL HISTORY:  Social History   Socioeconomic History  . Marital status: Married    Spouse name: Not on file  . Number of children: Not on file  . Years of education:  Not on file  . Highest education level: Not on file  Occupational History  . Not on file  Social Needs  . Financial resource strain: Not on file  . Food insecurity    Worry: Not on file    Inability: Not on file  . Transportation needs    Medical: Not on file    Non-medical: Not on file  Tobacco Use  . Smoking status: Never Smoker  . Smokeless tobacco: Never Used  Substance and Sexual Activity  . Alcohol use: No  . Drug use: No  . Sexual activity: Yes    Comment: married  Lifestyle  . Physical activity    Days per week: Not on file    Minutes per session: Not on file  . Stress: Not on file  Relationships  . Social Herbalist on phone: Not on file    Gets together: Not on file    Attends religious service: Not on file    Active member of club or organization: Not on file  Attends meetings of clubs or organizations: Not on file    Relationship status: Not on file  . Intimate partner violence    Fear of current or ex partner: Not on file    Emotionally abused: Not on file    Physically abused: Not on file    Forced sexual activity: Not on file  Other Topics Concern  . Not on file  Social History Narrative   ** Merged History Encounter **        FAMILY HISTORY:  Family History  Problem Relation Age of Onset  . COPD Mother        d. 78  . Emphysema Mother   . Thyroid disease Mother   . Diabetes Father   . Hypertension Father   . Stroke Father        d. 46  . Kidney disease Sister        Stage III  . Thyroid disease Sister   . Cervical cancer Sister   . Cervical cancer Maternal Grandmother   . Heart attack Maternal Grandfather   . Stomach cancer Paternal Grandfather   . Bladder Cancer Paternal Aunt     CURRENT MEDICATIONS:  Outpatient Encounter Medications as of 02/27/2019  Medication Sig  . acetaminophen (TYLENOL) 500 MG tablet Take 1,000 mg by mouth 2 (two) times daily as needed for moderate pain or headache.   . cyanocobalamin 2000 MCG  tablet Take 2,000 mcg by mouth daily.  Marland Kitchen levothyroxine (SYNTHROID, LEVOTHROID) 88 MCG tablet TAKE 1 TABLET BY MOUTH ON AN EMPTY STOMACH IN THE MORNING DAILY  . Travoprost, BAK Free, (TRAVATAN) 0.004 % SOLN ophthalmic solution Place 1 drop into both eyes at bedtime.  . Vitamin D, Ergocalciferol, (DRISDOL) 50000 units CAPS capsule Take 50,000 Units by mouth every Monday.   . [DISCONTINUED] Dietary Management Product (XYZBAC) TABS TAKE 1 TABLET BY MOUTH EVERY DAY - use restat coupon  . [DISCONTINUED] DULoxetine (CYMBALTA) 30 MG capsule Take 2 capsules (60 mg total) by mouth at bedtime.  . [DISCONTINUED] halobetasol (ULTRAVATE) 0.05 % cream   . [DISCONTINUED] promethazine (PHENERGAN) 12.5 MG tablet Take 1 tablet (12.5 mg total) by mouth every 6 (six) hours as needed for nausea or vomiting.  . DULoxetine (CYMBALTA) 30 MG capsule Take 1 capsule (30 mg total) by mouth daily. Take 71m (1 capsule) for the first week, then take 626m(2 capsules) every night  . loperamide (IMODIUM A-D) 2 MG tablet Take 2 mg by mouth as needed for diarrhea or loose stools.  . Marland Kitchenmeprazole (PRILOSEC) 40 MG capsule    No facility-administered encounter medications on file as of 02/27/2019.     ALLERGIES:  Allergies  Allergen Reactions  . Other Swelling    RAW GREEN PEPPERS THROAT SWELLING   . Paclitaxel Anaphylaxis    Due to cremaphor component most likely High fever  . Adhesive [Tape] Rash  . Amoxicillin Rash    "BAD RASH"  Has patient had a PCN reaction causing immediate rash, facial/tongue/throat swelling, SOB or lightheadedness with hypotension: #  #  #  YES  #  #  #  Has patient had a PCN reaction causing severe rash involving mucus membranes or skin necrosis: No Has patient had a PCN reaction that required hospitalization No Has patient had a PCN reaction occurring within the last 10 years: #  #  #  YES  #  #  #  If all of the above answers are "NO", then may proceed with Cephalosporin use.   .Marland Kitchen  Codeine Nausea  And Vomiting    Can take with nausea med  . Hydrocodone Nausea Only    States if she takes it she has to use phenergan also  . Meperidine Nausea And Vomiting     PHYSICAL EXAM:  ECOG Performance status: 1  Vitals:   02/27/19 0800  BP: (!) 144/62  Pulse: 67  Resp: 16  Temp: 97.7 F (36.5 C)  SpO2: 97%   Filed Weights   02/27/19 0800  Weight: 239 lb 9 oz (108.7 kg)    Physical Exam Constitutional:      Appearance: Normal appearance. She is normal weight.  Cardiovascular:     Rate and Rhythm: Normal rate and regular rhythm.     Heart sounds: Normal heart sounds.  Pulmonary:     Effort: Pulmonary effort is normal.     Breath sounds: Normal breath sounds.  Abdominal:     General: There is no distension.     Palpations: Abdomen is soft. There is no mass.  Musculoskeletal: Normal range of motion.  Skin:    General: Skin is warm and dry.  Neurological:     Mental Status: She is alert and oriented to person, place, and time. Mental status is at baseline.  Psychiatric:        Mood and Affect: Mood normal.        Behavior: Behavior normal.   Breast: No palpable masses, no skin changes or nipple discharge, no adenopathy.   LABORATORY DATA:  I have reviewed the labs as listed.  CBC    Component Value Date/Time   WBC 6.1 02/20/2019 0915   RBC 4.31 02/20/2019 0915   HGB 13.9 02/20/2019 0915   HCT 42.5 02/20/2019 0915   PLT 292 02/20/2019 0915   MCV 98.6 02/20/2019 0915   MCH 32.3 02/20/2019 0915   MCHC 32.7 02/20/2019 0915   RDW 12.4 02/20/2019 0915   LYMPHSABS 1.5 02/20/2019 0915   MONOABS 0.4 02/20/2019 0915   EOSABS 0.2 02/20/2019 0915   BASOSABS 0.1 02/20/2019 0915   CMP Latest Ref Rng & Units 02/20/2019 08/15/2018 10/18/2017  Glucose 70 - 99 mg/dL 110(H) 106(H) 113(H)  BUN 8 - 23 mg/dL 24(H) 22 20  Creatinine 0.44 - 1.00 mg/dL 0.69 0.85 0.82  Sodium 135 - 145 mmol/L 139 137 139  Potassium 3.5 - 5.1 mmol/L 4.3 3.8 4.1  Chloride 98 - 111 mmol/L 107 103 105   CO2 22 - 32 mmol/L 22 24 24   Calcium 8.9 - 10.3 mg/dL 9.3 10.0 9.3  Total Protein 6.5 - 8.1 g/dL 7.5 7.5 7.2  Total Bilirubin 0.3 - 1.2 mg/dL 0.6 0.4 0.8  Alkaline Phos 38 - 126 U/L 51 48 62  AST 15 - 41 U/L 21 28 23   ALT 0 - 44 U/L 29 40 31       DIAGNOSTIC IMAGING:  I have independently reviewed the scans and discussed with the patient.    ASSESSMENT & PLAN:   Triple negative malignant neoplasm of breast ([facility]) 1.  Stage IIa right breast TNBC: - Diagnosed on 05/21/2015, grade 3 IDC, ER/PR/HER-2 negative, Ki-67 of 86%. -Neoadjuvant chemotherapy with AC x4 followed by Taxol x2 cycles.  She developed reaction to Taxol and received Abraxane instead and completed on 10/28/2015. -Right lumpectomy on 12/12/2015 with complete pathological response, YPT0, Y PN 0. - Physical exam today did not reveal any suspicious masses.  Right lumpectomy site is normal.  Mild tenderness present.  No palpable mass in bilateral breast. -  Mammogram on 08/07/2018 was BI-RADS Category 2. -She had genetic testing done which was negative for germline mutations. -We reviewed blood work which is within normal limits.  We will see her back in 6 months for follow-up.  I plan to repeat mammogram prior to next visit.  After that we will switch her to yearly visits. -I will also order a bone density test prior to next visit.  We will check her vitamin D level.  2.  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  She has pins-and-needles sensation in the feet.  She cannot stand for more than few minutes at a time. -She has tried gabapentin which has caused her drowsiness.  She is continuing to work at [employer]. - I have talked to her about starting her on Cymbalta 30 mg daily for 1 week with increasing it to 60 mg daily.  We talked about side effects in detail.  Total time spent is 25 minutes with more than 50% of the time spent face-to-face discussing surveillance  plan, counseling and coordination of care.    Orders placed this encounter:  Orders Placed This Encounter  Procedures  . MM DIAG BREAST TOMO BILATERAL  . DG Bone Density  . CBC with Differential/Platelet  . Comprehensive metabolic panel      Derek Jack, MD [place] [phone]

## 2019-02-27 NOTE — Patient Instructions (Addendum)
[facility] at [facility] Discharge Instructions  You were seen today by Dr. Katragadda. He went over your recent lab results. He will see you back in 6 months for labs and follow up.   Thank you for choosing [facility] at [facility] to provide your oncology and hematology care.  To afford each patient quality time with our provider, please arrive at least 15 minutes before your scheduled appointment time.   If you have a lab appointment with the Cancer Center please come in thru the  Main Entrance and check in at the main information desk  You need to re-schedule your appointment should you arrive 10 or more minutes late.  We strive to give you quality time with our providers, and arriving late affects you and other patients whose appointments are after yours.  Also, if you no show three or more times for appointments you may be dismissed from the clinic at the providers discretion.     Again, thank you for choosing [facility].  Our hope is that these requests will decrease the amount of time that you wait before being seen by our physicians.       _____________________________________________________________  Should you have questions after your visit to [facility], please contact our office at [phone] between the hours of 8:00 a.m. and 4:30 p.m.  Voicemails left after 4:00 p.m. will not be returned until the following business day.  For prescription refill requests, have your pharmacy contact our office and allow 72 hours.    Cancer Center Support Programs:   > Cancer Support Group  2nd Tuesday of the month 1pm-2pm, Journey Room    

## 2019-02-27 NOTE — Assessment & Plan Note (Signed)
1.  Stage IIa right breast TNBC: - Diagnosed on 05/21/2015, grade 3 IDC, ER/PR/HER-2 negative, Ki-67 of 86%. -Neoadjuvant chemotherapy with AC x4 followed by Taxol x2 cycles.  She developed reaction to Taxol and received Abraxane instead and completed on 10/28/2015. -Right lumpectomy on 12/12/2015 with complete pathological response, YPT0, Y PN 0. - Physical exam today did not reveal any suspicious masses.  Right lumpectomy site is normal.  Mild tenderness present.  No palpable mass in bilateral breast. - Mammogram on 08/07/2018 was BI-RADS Category 2. -She had genetic testing done which was negative for germline mutations. -We reviewed blood work which is within normal limits.  We will see her back in 6 months for follow-up.  I plan to repeat mammogram prior to next visit.  After that we will switch her to yearly visits. -I will also order a bone density test prior to next visit.  We will check her vitamin D level.  2.  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  She has pins-and-needles sensation in the feet.  She cannot stand for more than few minutes at a time. -She has tried gabapentin which has caused her drowsiness.  She is continuing to work at [employer]. - I have talked to her about starting her on Cymbalta 30 mg daily for 1 week with increasing it to 60 mg daily.  We talked about side effects in detail.

## 2019-03-01 ENCOUNTER — Telehealth (HOSPITAL_COMMUNITY): Payer: Self-pay | Admitting: *Deleted

## 2019-03-01 NOTE — Telephone Encounter (Signed)
Pt called into clinic with side effects from Cymbalta c/o nausea, dry heaves, headaches, and an increased heart rate. Dr. Delton Coombes made aware and stated the side effects of this medication are common. Provider also stated to make sure patient is taking medication at night and continue taking as prescribed. Called pt back and she stated she was taking medication before bedtime and I  told her Dr. Raliegh Ip recommended she still take the medication as prescribed and call the clinic back if symptoms get any worse. Pt verbalized understanding.

## 2019-04-17 ENCOUNTER — Other Ambulatory Visit (HOSPITAL_COMMUNITY): Payer: Self-pay | Admitting: Hematology

## 2019-04-17 DIAGNOSIS — G620 Drug-induced polyneuropathy: Secondary | ICD-10-CM

## 2019-04-17 DIAGNOSIS — T451X5A Adverse effect of antineoplastic and immunosuppressive drugs, initial encounter: Secondary | ICD-10-CM

## 2019-06-27 ENCOUNTER — Other Ambulatory Visit (HOSPITAL_COMMUNITY): Payer: Self-pay | Admitting: Hematology

## 2019-06-27 DIAGNOSIS — G620 Drug-induced polyneuropathy: Secondary | ICD-10-CM

## 2019-06-27 DIAGNOSIS — T451X5A Adverse effect of antineoplastic and immunosuppressive drugs, initial encounter: Secondary | ICD-10-CM

## 2019-08-23 ENCOUNTER — Other Ambulatory Visit (HOSPITAL_COMMUNITY): Payer: Self-pay | Admitting: Hematology

## 2019-08-23 DIAGNOSIS — G620 Drug-induced polyneuropathy: Secondary | ICD-10-CM

## 2019-09-04 ENCOUNTER — Encounter (HOSPITAL_COMMUNITY): Payer: BC Managed Care – PPO

## 2019-09-04 ENCOUNTER — Other Ambulatory Visit (HOSPITAL_COMMUNITY): Payer: BC Managed Care – PPO

## 2019-09-11 ENCOUNTER — Encounter (HOSPITAL_COMMUNITY): Payer: BC Managed Care – PPO

## 2019-09-11 ENCOUNTER — Ambulatory Visit (HOSPITAL_COMMUNITY): Payer: BC Managed Care – PPO | Admitting: Hematology

## 2019-09-12 ENCOUNTER — Other Ambulatory Visit (HOSPITAL_COMMUNITY): Payer: Self-pay | Admitting: Hematology

## 2019-09-12 DIAGNOSIS — R928 Other abnormal and inconclusive findings on diagnostic imaging of breast: Secondary | ICD-10-CM

## 2019-09-18 ENCOUNTER — Ambulatory Visit (HOSPITAL_COMMUNITY): Payer: BC Managed Care – PPO

## 2019-09-18 ENCOUNTER — Ambulatory Visit (HOSPITAL_COMMUNITY)
Admission: RE | Admit: 2019-09-18 | Discharge: 2019-09-18 | Disposition: A | Discharge status: Home / Self Care | Payer: BC Managed Care – PPO | Source: Ambulatory Visit | Attending: Hematology | Admitting: Hematology

## 2019-09-18 ENCOUNTER — Encounter (HOSPITAL_COMMUNITY): Payer: BC Managed Care – PPO

## 2019-09-18 ENCOUNTER — Inpatient Hospital Stay (HOSPITAL_COMMUNITY): Payer: BC Managed Care – PPO | Attending: Hematology

## 2019-09-18 ENCOUNTER — Other Ambulatory Visit: Payer: Self-pay

## 2019-09-18 DIAGNOSIS — Z780 Asymptomatic menopausal state: Secondary | ICD-10-CM | POA: Diagnosis present

## 2019-09-18 DIAGNOSIS — Z9221 Personal history of antineoplastic chemotherapy: Secondary | ICD-10-CM | POA: Insufficient documentation

## 2019-09-18 DIAGNOSIS — C50919 Malignant neoplasm of unspecified site of unspecified female breast: Secondary | ICD-10-CM

## 2019-09-18 DIAGNOSIS — C50911 Malignant neoplasm of unspecified site of right female breast: Secondary | ICD-10-CM | POA: Diagnosis present

## 2019-09-18 DIAGNOSIS — M8580 Other specified disorders of bone density and structure, unspecified site: Secondary | ICD-10-CM | POA: Insufficient documentation

## 2019-09-18 DIAGNOSIS — Z171 Estrogen receptor negative status [ER-]: Secondary | ICD-10-CM | POA: Diagnosis present

## 2019-09-18 LAB — COMPREHENSIVE METABOLIC PANEL
ALT: 29 U/L (ref 0–44)
AST: 23 U/L (ref 15–41)
Albumin: 3.8 g/dL (ref 3.5–5.0)
Alkaline Phosphatase: 49 U/L (ref 38–126)
Anion gap: 6 (ref 5–15)
BUN: 19 mg/dL (ref 8–23)
CO2: 26 mmol/L (ref 22–32)
Calcium: 9.5 mg/dL (ref 8.9–10.3)
Chloride: 108 mmol/L (ref 98–111)
Creatinine, Ser: 0.81 mg/dL (ref 0.44–1.00)
GFR calc Af Amer: >60 mL/min (ref >60)
GFR calc non Af Amer: >60 mL/min (ref >60)
Glucose, Bld: 104 mg/dL — ABNORMAL HIGH (ref 70–99)
Potassium: 4.1 mmol/L (ref 3.5–5.1)
Sodium: 140 mmol/L (ref 135–145)
Total Bilirubin: 0.6 mg/dL (ref 0.3–1.2)
Total Protein: 7.3 g/dL (ref 6.5–8.1)

## 2019-09-18 LAB — CBC WITH DIFFERENTIAL/PLATELET
Abs Immature Granulocytes: 0.01 10*3/uL (ref 0.00–0.07)
Basophils Absolute: 0.1 10*3/uL (ref 0.0–0.1)
Basophils Relative: 1 %
Eosinophils Absolute: 0.3 10*3/uL (ref 0.0–0.5)
Eosinophils Relative: 5 %
HCT: 40.4 % (ref 36.0–46.0)
Hemoglobin: 13 g/dL (ref 12.0–15.0)
Immature Granulocytes: 0 %
Lymphocytes Relative: 29 %
Lymphs Abs: 1.5 10*3/uL (ref 0.7–4.0)
MCH: 32 pg (ref 26.0–34.0)
MCHC: 32.2 g/dL (ref 30.0–36.0)
MCV: 99.5 fL (ref 80.0–100.0)
Monocytes Absolute: 0.5 10*3/uL (ref 0.1–1.0)
Monocytes Relative: 10 %
Neutro Abs: 2.8 10*3/uL (ref 1.7–7.7)
Neutrophils Relative %: 55 %
Platelets: 259 10*3/uL (ref 150–400)
RBC: 4.06 MIL/uL (ref 3.87–5.11)
RDW: 12.9 % (ref 11.5–15.5)
WBC: 5.2 10*3/uL (ref 4.0–10.5)
nRBC: 0 % (ref 0.0–0.2)

## 2019-09-24 ENCOUNTER — Ambulatory Visit (HOSPITAL_COMMUNITY): Payer: BC Managed Care – PPO | Admitting: Hematology

## 2019-09-24 NOTE — Assessment & Plan Note (Deleted)
1.  Stage IIa right breast TMB C: -Diagnosed on 05/21/2015, ER/PR/HER-2 negative, Ki-67 86%, grade 3. -Neoadjuvant chemotherapy with AC x4 followed by Taxol x2 cycles.  She developed reaction to Taxol and received Abraxane in place and completed on 10/28/2015. -Right lumpectomy on 12/12/2015 with complete pathological response, YPT0Y PN 0. -We reviewed mammogram from 09/18/2019 which was BI-RADS Category 2. -Germline mutation testing was negative. -We reviewed CBC and LFTs which were within normal limits.  2.  Osteopenia: -We reviewed results of bone density from 09/18/2019 which shows T score of -1.6. -She is taking vitamin D 50,000 units weekly.  She will take calcium supplements also.  3.  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  She has pins-and-needles sensation in the feet. -She has used gabapentin which caused drowsiness.  She is using Cymbalta 60 mg daily.

## 2019-10-04 ENCOUNTER — Ambulatory Visit (HOSPITAL_COMMUNITY): Payer: BC Managed Care – PPO | Admitting: Hematology

## 2019-10-08 ENCOUNTER — Encounter (HOSPITAL_COMMUNITY): Payer: Self-pay | Admitting: Hematology

## 2019-10-08 ENCOUNTER — Other Ambulatory Visit: Payer: Self-pay

## 2019-10-08 ENCOUNTER — Inpatient Hospital Stay (HOSPITAL_BASED_OUTPATIENT_CLINIC_OR_DEPARTMENT_OTHER): Payer: BC Managed Care – PPO | Admitting: Hematology

## 2019-10-08 VITALS — BP 125/73 | HR 85 | Temp 96.9°F | Resp 16 | Wt 225.3 lb

## 2019-10-08 DIAGNOSIS — G620 Drug-induced polyneuropathy: Secondary | ICD-10-CM | POA: Diagnosis not present

## 2019-10-08 DIAGNOSIS — C50911 Malignant neoplasm of unspecified site of right female breast: Secondary | ICD-10-CM | POA: Diagnosis not present

## 2019-10-08 DIAGNOSIS — C50919 Malignant neoplasm of unspecified site of unspecified female breast: Secondary | ICD-10-CM | POA: Diagnosis not present

## 2019-10-08 DIAGNOSIS — T451X5A Adverse effect of antineoplastic and immunosuppressive drugs, initial encounter: Secondary | ICD-10-CM | POA: Diagnosis not present

## 2019-10-08 NOTE — Progress Notes (Signed)
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  BRIEF ONCOLOGIC HISTORY:  Oncology History  Triple negative malignant neoplasm of breast ([place])  06/17/2015 Pathology Results   Consult Slide , right breast - INVASIVE DUCTAL CARCINOMA. - DUCTAL CARCINOMA IN SITU. - SEE COMMENT. Microscopic Comment The carcinoma appears grade 3. Per outside report, the tumor cells are negative for estrogen receptor, progesterone receptor and Her 2 neu. Ki-67 is 86%. (JBK:ds 06/17/15) JOSHUA KISH MD   06/20/2015 Imaging   MRI breast Right breast: In the upper-outer quadrant posterior 1/3 depth right breast, there is a 2.1 x 1.3 x 1.4 cm spiculated enhancing mass with washout enhancement kinetics and associated biopsy clip consistent with patient's known cancer.   06/30/2015 - 08/19/2015 Chemotherapy   Dose Dense AC x 4    09/02/2015 - 10/28/2015 Chemotherapy   Dose Dense Taxol X 2, reaction, then changed to abraxane    11/03/2015 Imaging   MRI breast Complete imaging response to neoadjuvant chemotherapy. No mass or abnormal enhancement is seen today.   12/12/2015 Surgery   RIGHT BREAST LUMPECTOMY WITH RADIOACTIVE SEED AND SENTINEL LYMPH NODE BIOPSY REMOVAL PORT-A-CATH, Dr. Excell Seltzer   12/12/2015 Pathology Results   Breast, lumpectomy, Right - FIBROSIS, HEMOSIDERIN DEPOSITION AND FOCAL GIANT CELL REACTION. - NO RESIDUAL TUMOR. - MARGINS NOT INVOLVED. 2. Lymph node, sentinel, biopsy, Right axillary #1 - ONE BENIGN LYMPH NODE (0/1). 3. Lymph node, sentinel, biopsy, Right axillary #2 - ONE BENIGN LYMPH NODE (0/1). 4. Lymph node, sentinel, biopsy, Right axillary #3 - ONE BENIGN LYMPH NODE (0/1). 5. Lymph  node, sentinel, biopsy, Right axillary #4 - ONE BENIGN LYMPH NODE (0/1).   08/03/2016 Mammogram   IMPRESSION: Lumpectomy and radiation changes of the right breast. No evidence of malignancy in either breast.   10/23/2018 Genetic Testing   Negative genetic testing on the common hereditary cancer panel.  The Hereditary Gene Panel offered by [facility] includes sequencing and/or deletion duplication testing of the following 48 genes: APC, ATM, AXIN2, BARD1, BMPR1A, BRCA1, BRCA2, BRIP1, CDH1, CDK4, CDKN2A (p14ARF), CDKN2A (p16INK4a), CHEK2, CTNNA1, DICER1, EPCAM (Deletion/duplication testing only), GREM1 (promoter region deletion/duplication testing only), KIT, MEN1, MLH1, MSH2, MSH3, MSH6, MUTYH, NBN, NF1, NHTL1, PALB2, PDGFRA, PMS2, POLD1, POLE, PTEN, RAD50, RAD51C, RAD51D, RNF43, SDHB, SDHC, SDHD, SMAD4, SMARCA4. STK11, TP53, TSC1, TSC2, and VHL.  The following genes were evaluated for sequence changes only: SDHA and HOXB13 c.251G>A variant only. The report date is October 23, 2018.      INTERVAL HISTORY:  Ms. Nowak 64 y.o. female seen for follow-up of triple negative breast cancer of the right breast.  Appetite is 100%.  Energy levels are 50%.  Numbness in the feet has been stable.  She is taking Cymbalta 30 mg at bedtime.  Denies any new onset pains.  Denies fevers, night sweats or weight loss.  REVIEW OF SYSTEMS:  Review of Systems  Constitutional: Negative for fatigue.  Neurological: Positive for numbness.  All other systems reviewed and are negative.    PAST MEDICAL/SURGICAL HISTORY:  Past Medical History:  Diagnosis Date  . Achilles tendon pain   . Anemia   . Arthritis    knees  . Breast cancer ([place])   . Cancer ([place])  right breast-already finished chemo  . Cataracts, bilateral   . Family history of bladder cancer   . Family history of stomach cancer   . GERD (gastroesophageal reflux disease)   . Glaucoma   . Hypothyroidism   . Neuropathy    FROM CHEMO     . Psoriasis     elbows, knees  . Psoriasis    bil legs, elbows and hands  . TFCC (triangular fibrocartilage complex) tear    left   Past Surgical History:  Procedure Laterality Date  . ABDOMINAL HYSTERECTOMY     partial  . BREAST LUMPECTOMY WITH RADIOACTIVE SEED AND SENTINEL LYMPH NODE BIOPSY Right 12/12/2015   Procedure: RIGHT BREAST LUMPECTOMY WITH RADIOACTIVE SEED AND SENTINEL LYMPH NODE BIOPSY;  Surgeon: Excell Seltzer, MD;  Location: [facility];  Service: General;  Laterality: Right;  . BREAST SURGERY    . BUNIONECTOMY Left   . CHOLECYSTECTOMY  09/2010  . CHOLECYSTECTOMY    . CYST EXCISION Left    wrist  . DILATION AND CURETTAGE OF UTERUS    . FOOT SURGERY  2004   left  . HAND TENDON SURGERY Left 2012  . KNEE ARTHROSCOPY Left    x2  . KNEE SURGERY  2000, 2008   left  . PARTIAL HYSTERECTOMY  1989  . PORT-A-CATH REMOVAL Left 12/12/2015   Procedure: REMOVAL PORT-A-CATH;  Surgeon: Excell Seltzer, MD;  Location: [facility];  Service: General;  Laterality: Left;  . PORTACATH PLACEMENT Left 06/27/2015   Procedure: INSERTION PORT-A-CATH;  Surgeon: Excell Seltzer, MD;  Location: [facility];  Service: General;  Laterality: Left;  . PTOSIS REPAIR Bilateral 10/21/2016   Procedure: INTERNAL PTOSIS REPAIR;  Surgeon: Clista Bernhardt, MD;  Location: [facility];  Service: Plastics;  Laterality: Bilateral;  . WRIST ARTHROSCOPY  06/29/2011   Procedure: ARTHROSCOPY WRIST;  Surgeon: Tennis Must;  Location: [facility];  Service: Orthopedics;  Laterality: Left;  left wrist tfcc repair     SOCIAL HISTORY:  Social History   Socioeconomic History  . Marital status: Married    Spouse name: Not on file  . Number of children: Not on file  . Years of education: Not on file  . Highest education level: Not on file  Occupational History  . Not on file  Tobacco Use  . Smoking status: Never Smoker  . Smokeless tobacco: Never Used  Substance and  Sexual Activity  . Alcohol use: No  . Drug use: No  . Sexual activity: Yes    Comment: married  Other Topics Concern  . Not on file  Social History Narrative   ** Merged History Encounter **       Social Determinants of Health   Financial Resource Strain:   . Difficulty of Paying Living Expenses:   Food Insecurity:   . Worried About Charity fundraiser in the Last Year:   . Arboriculturist in the Last Year:   Transportation Needs:   . Film/video editor (Medical):   Marland Kitchen Lack of Transportation (Non-Medical):   Physical Activity:   . Days of Exercise per Week:   . Minutes of Exercise per Session:   Stress:   . Feeling of Stress :   Social Connections:   . Frequency of Communication with Friends and Family:   . Frequency of Social Gatherings with Friends and Family:   . Attends Religious Services:   . Active Member of Clubs or Organizations:   .  Attends Archivist Meetings:   Marland Kitchen Marital Status:   Intimate Partner Violence:   . Fear of Current or Ex-Partner:   . Emotionally Abused:   Marland Kitchen Physically Abused:   . Sexually Abused:     FAMILY HISTORY:  Family History  Problem Relation Age of Onset  . COPD Mother        d. 71  . Emphysema Mother   . Thyroid disease Mother   . Diabetes Father   . Hypertension Father   . Stroke Father        d. 32  . Kidney disease Sister        Stage III  . Thyroid disease Sister   . Cervical cancer Sister   . Cervical cancer Maternal Grandmother   . Heart attack Maternal Grandfather   . Stomach cancer Paternal Grandfather   . Bladder Cancer Paternal Aunt     CURRENT MEDICATIONS:  Outpatient Encounter Medications as of 10/08/2019  Medication Sig  . cyanocobalamin 2000 MCG tablet Take 2,000 mcg by mouth daily.  . Dietary Management Product (XYZBAC) TABS Take by mouth.  . DULoxetine (CYMBALTA) 30 MG capsule TAKE ONE CAPSULE BY MOUTH DAILY, FOR THE first WEEK AND THEN TAKE TWO CAPSULES EVERY night  . levothyroxine  (SYNTHROID, LEVOTHROID) 88 MCG tablet TAKE 1 TABLET BY MOUTH ON AN EMPTY STOMACH IN THE MORNING DAILY  . omeprazole (PRILOSEC) 40 MG capsule Take 40 mg by mouth daily.   . Travoprost, BAK Free, (TRAVATAN) 0.004 % SOLN ophthalmic solution Place 1 drop into both eyes at bedtime.  . Vitamin D, Ergocalciferol, (DRISDOL) 50000 units CAPS capsule Take 50,000 Units by mouth every Monday.   Marland Kitchen acetaminophen (TYLENOL) 500 MG tablet Take 1,000 mg by mouth 2 (two) times daily as needed for moderate pain or headache.   . loperamide (IMODIUM A-D) 2 MG tablet Take 2 mg by mouth as needed for diarrhea or loose stools.  . [DISCONTINUED] latanoprost (XALATAN) 0.005 % ophthalmic solution Apply to eye.   No facility-administered encounter medications on file as of 10/08/2019.    ALLERGIES:  Allergies  Allergen Reactions  . Other Swelling    RAW GREEN PEPPERS THROAT SWELLING   . Paclitaxel Anaphylaxis    Due to cremaphor component most likely High fever  . Adhesive [Tape] Rash  . Amoxicillin Rash    "BAD RASH"  Has patient had a PCN reaction causing immediate rash, facial/tongue/throat swelling, SOB or lightheadedness with hypotension: #  #  #  YES  #  #  #  Has patient had a PCN reaction causing severe rash involving mucus membranes or skin necrosis: No Has patient had a PCN reaction that required hospitalization No Has patient had a PCN reaction occurring within the last 10 years: #  #  #  YES  #  #  #  If all of the above answers are "NO", then may proceed with Cephalosporin use.   . Codeine Nausea And Vomiting    Can take with nausea med  . Hydrocodone Nausea Only    States if she takes it she has to use phenergan also  . Meperidine Nausea And Vomiting     PHYSICAL EXAM:  ECOG Performance status: 1  Vitals:   10/08/19 0812  BP: 125/73  Pulse: 85  Resp: 16  Temp: (!) 96.9 F (36.1 C)  SpO2: 95%   Filed Weights   10/08/19 0812  Weight: 225 lb 4.8 oz (102.2 kg)    Physical  Exam Constitutional:      Appearance: Normal appearance. She is normal weight.  Cardiovascular:     Rate and Rhythm: Normal rate and regular rhythm.     Heart sounds: Normal heart sounds.  Pulmonary:     Effort: Pulmonary effort is normal.     Breath sounds: Normal breath sounds.  Abdominal:     General: There is no distension.     Palpations: Abdomen is soft. There is no mass.  Musculoskeletal:        General: Normal range of motion.  Skin:    General: Skin is warm and dry.  Neurological:     Mental Status: She is alert and oriented to person, place, and time. Mental status is at baseline.  Psychiatric:        Mood and Affect: Mood normal.        Behavior: Behavior normal.   Breast: No palpable masses.  No palpable adenopathy bilaterally.   LABORATORY DATA:  I have reviewed the labs as listed.  CBC    Component Value Date/Time   WBC 5.2 09/18/2019 0828   RBC 4.06 09/18/2019 0828   HGB 13.0 09/18/2019 0828   HCT 40.4 09/18/2019 0828   PLT 259 09/18/2019 0828   MCV 99.5 09/18/2019 0828   MCH 32.0 09/18/2019 0828   MCHC 32.2 09/18/2019 0828   RDW 12.9 09/18/2019 0828   LYMPHSABS 1.5 09/18/2019 0828   MONOABS 0.5 09/18/2019 0828   EOSABS 0.3 09/18/2019 0828   BASOSABS 0.1 09/18/2019 0828   CMP Latest Ref Rng & Units 09/18/2019 02/20/2019 08/15/2018  Glucose 70 - 99 mg/dL 104(H) 110(H) 106(H)  BUN 8 - 23 mg/dL 19 24(H) 22  Creatinine 0.44 - 1.00 mg/dL 0.81 0.69 0.85  Sodium 135 - 145 mmol/L 140 139 137  Potassium 3.5 - 5.1 mmol/L 4.1 4.3 3.8  Chloride 98 - 111 mmol/L 108 107 103  CO2 22 - 32 mmol/L _0 Calcium 8.9 - 10.3 mg/dL 9.5 9.3 10.0  Total Protein 6.5 - 8.1 g/dL 7.3 7.5 7.5  Total Bilirubin 0.3 - 1.2 mg/dL 0.6 0.6 0.4  Alkaline Phos 38 - 126 U/L 49 51 48  AST 15 - 41 U/L _1 ALT 0 - 44 U/L 29 29 40       DIAGNOSTIC IMAGING:  I have reviewed the scans.    ASSESSMENT & PLAN:   Triple negative malignant neoplasm of breast ([place]) 1.  Stage  IIa right breast TNBC: -Diagnosed on 05/21/2015, ER/PR/HER-2 negative, Ki-67 86%, grade 3 -Neoadjuvant chemotherapy with AC x4 followed by Taxol x2 cycles.  She developed reaction to Taxol and received Abraxane in place and completed on 10/28/2015. -Right lumpectomy on 12/12/2015 with complete pathological response, YPT0Y PN 0. -Germline mutation testing was negative. -Mammogram reviewed on 09/18/2019 shows BI-RADS Category 2. -Her labs including CBC and LFTs were within normal limits. -Physical examination today did not reveal any palpable masses.  We will reevaluate her in 6 months for follow-up.  2.  Osteopenia: -We reviewed results of bone density from 09/18/2019 which shows T score of -1.6. -She is taking vitamin D 50,000 units daily.  She will also continue calcium supplements.  3.  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  -She has used gabapentin in the past which cause drowsiness.  She is taking Cymbalta 30 mg at bedtime.  It is helping with the pain.     Orders placed this encounter:  Orders Placed  This Encounter  Procedures  . CBC with Differential/Platelet  . Comprehensive metabolic panel  . Vitamin D 25 hydroxy  . Hepatic function panel      Derek Jack, MD [place] [phone]

## 2019-10-08 NOTE — Assessment & Plan Note (Addendum)
1.  Stage IIa right breast TNBC: -Diagnosed on 05/21/2015, ER/PR/HER-2 negative, Ki-67 86%, grade 3 -Neoadjuvant chemotherapy with AC x4 followed by Taxol x2 cycles.  She developed reaction to Taxol and received Abraxane in place and completed on 10/28/2015. -Right lumpectomy on 12/12/2015 with complete pathological response, YPT0Y PN 0. -Germline mutation testing was negative. -Mammogram reviewed on 09/18/2019 shows BI-RADS Category 2. -Her labs including CBC and LFTs were within normal limits. -Physical examination today did not reveal any palpable masses.  We will reevaluate her in 6 months for follow-up.  2.  Osteopenia: -We reviewed results of bone density from 09/18/2019 which shows T score of -1.6. -She is taking vitamin D 50,000 units daily.  She will also continue calcium supplements.  3.  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  -She has used gabapentin in the past which cause drowsiness.  She is taking Cymbalta 30 mg at bedtime.  It is helping with the pain.

## 2019-10-08 NOTE — Patient Instructions (Addendum)
[place] at [facility] Discharge Instructions  You were seen today by Dr. Delton Coombes. He went over your recent lab, bone density results. Continue taking Vitamin D and Cymbalta as directed. He will see you back in 6 months for labs and follow up.   Thank you for choosing [place] at [facility] to provide your oncology and hematology care.  To afford each patient quality time with our provider, please arrive at least 15 minutes before your scheduled appointment time.   If you have a lab appointment with the [facility] please come in thru the  Main Entrance and check in at the main information desk  You need to re-schedule your appointment should you arrive 10 or more minutes late.  We strive to give you quality time with our providers, and arriving late affects you and other patients whose appointments are after yours.  Also, if you no show three or more times for appointments you may be dismissed from the clinic at the providers discretion.     Again, thank you for choosing [facility]  Our hope is that these requests will decrease the amount of time that you wait before being seen by our physicians.       _____________________________________________________________  Should you have questions after your visit to [facility], please contact our office at [phone] between the hours of 8:00 a.m. and 4:30 p.m.  Voicemails left after 4:00 p.m. will not be returned until the following business day.  For prescription refill requests, have your pharmacy contact our office and allow 72 hours.    Cancer Center Support Programs:   > Cancer Support Group  2nd Tuesday of the month 1pm-2pm, Journey Room

## 2019-10-29 ENCOUNTER — Other Ambulatory Visit (HOSPITAL_COMMUNITY): Payer: Self-pay | Admitting: Hematology

## 2019-10-29 DIAGNOSIS — G620 Drug-induced polyneuropathy: Secondary | ICD-10-CM

## 2019-12-27 ENCOUNTER — Other Ambulatory Visit (HOSPITAL_COMMUNITY): Payer: Self-pay | Admitting: Hematology

## 2019-12-27 DIAGNOSIS — T451X5A Adverse effect of antineoplastic and immunosuppressive drugs, initial encounter: Secondary | ICD-10-CM

## 2020-04-07 ENCOUNTER — Other Ambulatory Visit: Payer: Self-pay

## 2020-04-07 ENCOUNTER — Inpatient Hospital Stay (HOSPITAL_COMMUNITY): Payer: BC Managed Care – PPO | Attending: Hematology

## 2020-04-07 DIAGNOSIS — C50919 Malignant neoplasm of unspecified site of unspecified female breast: Secondary | ICD-10-CM

## 2020-04-07 DIAGNOSIS — Z9221 Personal history of antineoplastic chemotherapy: Secondary | ICD-10-CM | POA: Insufficient documentation

## 2020-04-07 DIAGNOSIS — Z171 Estrogen receptor negative status [ER-]: Secondary | ICD-10-CM | POA: Insufficient documentation

## 2020-04-07 DIAGNOSIS — G620 Drug-induced polyneuropathy: Secondary | ICD-10-CM | POA: Diagnosis not present

## 2020-04-07 DIAGNOSIS — C50411 Malignant neoplasm of upper-outer quadrant of right female breast: Secondary | ICD-10-CM | POA: Diagnosis not present

## 2020-04-07 DIAGNOSIS — T451X5A Adverse effect of antineoplastic and immunosuppressive drugs, initial encounter: Secondary | ICD-10-CM | POA: Insufficient documentation

## 2020-04-07 DIAGNOSIS — M8580 Other specified disorders of bone density and structure, unspecified site: Secondary | ICD-10-CM | POA: Insufficient documentation

## 2020-04-07 DIAGNOSIS — Z79899 Other long term (current) drug therapy: Secondary | ICD-10-CM | POA: Insufficient documentation

## 2020-04-07 LAB — COMPREHENSIVE METABOLIC PANEL
ALT: 24 U/L (ref 0–44)
AST: 19 U/L (ref 15–41)
Albumin: 3.6 g/dL (ref 3.5–5.0)
Alkaline Phosphatase: 46 U/L (ref 38–126)
Anion gap: 10 (ref 5–15)
BUN: 22 mg/dL (ref 8–23)
CO2: 20 mmol/L — ABNORMAL LOW (ref 22–32)
Calcium: 9.3 mg/dL (ref 8.9–10.3)
Chloride: 108 mmol/L (ref 98–111)
Creatinine, Ser: 0.78 mg/dL (ref 0.44–1.00)
GFR calc Af Amer: >60 mL/min (ref >60)
GFR calc non Af Amer: >60 mL/min (ref >60)
Glucose, Bld: 133 mg/dL — ABNORMAL HIGH (ref 70–99)
Potassium: 3.7 mmol/L (ref 3.5–5.1)
Sodium: 138 mmol/L (ref 135–145)
Total Bilirubin: 0.6 mg/dL (ref 0.3–1.2)
Total Protein: 6.6 g/dL (ref 6.5–8.1)

## 2020-04-07 LAB — CBC WITH DIFFERENTIAL/PLATELET
Abs Immature Granulocytes: 0.02 10*3/uL (ref 0.00–0.07)
Basophils Absolute: 0.1 10*3/uL (ref 0.0–0.1)
Basophils Relative: 1 %
Eosinophils Absolute: 0.2 10*3/uL (ref 0.0–0.5)
Eosinophils Relative: 4 %
HCT: 40.9 % (ref 36.0–46.0)
Hemoglobin: 13.3 g/dL (ref 12.0–15.0)
Immature Granulocytes: 0 %
Lymphocytes Relative: 27 %
Lymphs Abs: 1.5 10*3/uL (ref 0.7–4.0)
MCH: 32 pg (ref 26.0–34.0)
MCHC: 32.5 g/dL (ref 30.0–36.0)
MCV: 98.3 fL (ref 80.0–100.0)
Monocytes Absolute: 0.4 10*3/uL (ref 0.1–1.0)
Monocytes Relative: 7 %
Neutro Abs: 3.5 10*3/uL (ref 1.7–7.7)
Neutrophils Relative %: 61 %
Platelets: 254 10*3/uL (ref 150–400)
RBC: 4.16 MIL/uL (ref 3.87–5.11)
RDW: 12.7 % (ref 11.5–15.5)
WBC: 5.8 10*3/uL (ref 4.0–10.5)
nRBC: 0 % (ref 0.0–0.2)

## 2020-04-07 LAB — VITAMIN D 25 HYDROXY (VIT D DEFICIENCY, FRACTURES): Vit D, 25-Hydroxy: 73.61 ng/mL (ref 30–100)

## 2020-04-14 ENCOUNTER — Other Ambulatory Visit (HOSPITAL_COMMUNITY): Payer: Self-pay | Admitting: Hematology

## 2020-04-14 ENCOUNTER — Inpatient Hospital Stay (HOSPITAL_COMMUNITY): Payer: BC Managed Care – PPO | Admitting: Hematology

## 2020-04-14 ENCOUNTER — Other Ambulatory Visit: Payer: Self-pay

## 2020-04-14 VITALS — BP 148/70 | HR 89 | Temp 97.3°F | Resp 18 | Wt 228.8 lb

## 2020-04-14 DIAGNOSIS — C50919 Malignant neoplasm of unspecified site of unspecified female breast: Secondary | ICD-10-CM | POA: Diagnosis not present

## 2020-04-14 DIAGNOSIS — G620 Drug-induced polyneuropathy: Secondary | ICD-10-CM | POA: Diagnosis not present

## 2020-04-14 DIAGNOSIS — T451X5A Adverse effect of antineoplastic and immunosuppressive drugs, initial encounter: Secondary | ICD-10-CM | POA: Diagnosis not present

## 2020-04-14 DIAGNOSIS — C50411 Malignant neoplasm of upper-outer quadrant of right female breast: Secondary | ICD-10-CM | POA: Diagnosis not present

## 2020-04-14 NOTE — Patient Instructions (Signed)
[place] at [facility] Discharge Instructions  You were seen today by Dr. Delton Coombes. He went over your recent results. You may proceed with getting your COVID vaccine. You will be scheduled for a mammogram in March 2022. Dr. Delton Coombes will see you back in 6 months for labs and follow up.   Thank you for choosing [place] at [facility] to provide your oncology and hematology care.  To afford each patient quality time with our provider, please arrive at least 15 minutes before your scheduled appointment time.   If you have a lab appointment with the [facility] please come in thru the Main Entrance and check in at the main information desk  You need to re-schedule your appointment should you arrive 10 or more minutes late.  We strive to give you quality time with our providers, and arriving late affects you and other patients whose appointments are after yours.  Also, if you no show three or more times for appointments you may be dismissed from the clinic at the providers discretion.     Again, thank you for choosing [facility].  Our hope is that these requests will decrease the amount of time that you wait before being seen by our physicians.       _____________________________________________________________  Should you have questions after your visit to [facility], please contact our office at [phone] between the hours of 8:00 a.m. and 4:30 p.m.  Voicemails left after 4:00 p.m. will not be returned until the following business day.  For prescription refill requests, have your pharmacy contact our office and allow 72 hours.    Cancer Center Support Programs:   > Cancer Support Group  2nd Tuesday of the month 1pm-2pm, Journey Room

## 2020-04-14 NOTE — Progress Notes (Signed)
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 - DUCTAL CARCINOMA IN SITU. - SEE COMMENT. Microscopic Comment The carcinoma appears grade 3. Per outside report, the tumor cells are negative for estrogen receptor, progesterone receptor and Her 2 neu. Ki-67 is 86%. (JBK:ds 06/17/15) JOSHUA KISH MD   06/20/2015 Imaging   MRI breast Right breast: In the upper-outer quadrant posterior 1/3 depth right breast, there is a 2.1 x 1.3 x 1.4 cm spiculated enhancing mass with washout enhancement kinetics and associated biopsy clip consistent with patient's known cancer.   06/30/2015 - 08/19/2015 Chemotherapy   Dose Dense AC x 4    09/02/2015 - 10/28/2015 Chemotherapy   Dose Dense Taxol X 2, reaction, then changed to abraxane    11/03/2015 Imaging   MRI breast Complete imaging response to neoadjuvant chemotherapy. No mass or abnormal enhancement is seen today.   12/12/2015 Surgery   RIGHT BREAST LUMPECTOMY WITH RADIOACTIVE SEED AND SENTINEL LYMPH NODE BIOPSY REMOVAL PORT-A-CATH, Dr. Excell Seltzer   12/12/2015 Pathology Results   Breast, lumpectomy, Right - FIBROSIS, HEMOSIDERIN DEPOSITION AND FOCAL GIANT CELL REACTION. - NO RESIDUAL TUMOR. - MARGINS NOT INVOLVED. 2. Lymph node, sentinel, biopsy, Right axillary #1 - ONE BENIGN LYMPH NODE (0/1). 3. Lymph node, sentinel, biopsy, Right axillary #2 - ONE BENIGN LYMPH NODE (0/1). 4. Lymph node, sentinel, biopsy, Right axillary #3 - ONE BENIGN LYMPH NODE (0/1). 5. Lymph node, sentinel, biopsy, Right axillary #4 - ONE BENIGN LYMPH NODE (0/1).   08/03/2016 Mammogram   IMPRESSION: Lumpectomy and radiation changes of the right breast. No evidence of malignancy in  either breast.   10/23/2018 Genetic Testing   Negative genetic testing on the common hereditary cancer panel.  The Hereditary Gene Panel offered by [facility] includes sequencing and/or deletion duplication testing of the following 48 genes: APC, ATM, AXIN2, BARD1, BMPR1A, BRCA1, BRCA2, BRIP1, CDH1, CDK4, CDKN2A (p14ARF), CDKN2A (p16INK4a), CHEK2, CTNNA1, DICER1, EPCAM (Deletion/duplication testing only), GREM1 (promoter region deletion/duplication testing only), KIT, MEN1, MLH1, MSH2, MSH3, MSH6, MUTYH, NBN, NF1, NHTL1, PALB2, PDGFRA, PMS2, POLD1, POLE, PTEN, RAD50, RAD51C, RAD51D, RNF43, SDHB, SDHC, SDHD, SMAD4, SMARCA4. STK11, TP53, TSC1, TSC2, and VHL.  The following genes were evaluated for sequence changes only: SDHA and HOXB13 c.251G>A variant only. The report date is October 23, 2018.     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 is a 64 y.o. 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 Her last visit was on 10/08/2019.   Today she reports feeling well and denies any new issues since her last visit. She continues taking vitamin D weekly. She reports getting occasional flares in her psoriasis and arthritis, but nothing new. She denies feeling any new breast lumps. She denies having hematuria. She continues taking Cymbalta for her neuropathy which is helping with the pain.  She is inquiring about getting the COVID vaccine.   REVIEW OF SYSTEMS:   Review of Systems  Constitutional: Positive for fatigue. Negative for appetite change.  Respiratory: Positive for shortness of breath.   Genitourinary: Positive for frequency (w/ small amount). Negative for hematuria.  Neurological: Positive for headaches and numbness (hands & feet).  All other systems reviewed and are negative.   I have reviewed the past medical history, past  surgical history, social history and family history with the patient and they are unchanged from previous note.   ALLERGIES:   is allergic to other, paclitaxel, adhesive [tape], amoxicillin, codeine, hydrocodone, and meperidine.   MEDICATIONS:  Current Outpatient Medications  Medication Sig Dispense Refill  . acetaminophen (TYLENOL) 500 MG tablet Take 1,000 mg by mouth 2 (two) times daily as needed for moderate pain or headache.     . clobetasol ointment (TEMOVATE) 0.05 % APPLY TO THE AFFECTED AREA(S) A SMALL AMOUNT TWICE DAILY AS NEEDED FOR TWO WEEKS ON, TWO WEEKS OFF. DO NOT APPLY TO FACE OR BETWEEN LEGS ARE    . cyanocobalamin 2000 MCG tablet Take 2,000 mcg by mouth daily.    . Dietary Management Product (XYZBAC) TABS Take by mouth.    . DULoxetine (CYMBALTA) 30 MG capsule TAKE ONE CAPSULE BY MOUTH DAILY, FOR THE first WEEK AND THEN TAKE TWO CAPSULES EVERY night 60 capsule 4  . levothyroxine (SYNTHROID, LEVOTHROID) 88 MCG tablet TAKE 1 TABLET BY MOUTH ON AN EMPTY STOMACH IN THE MORNING DAILY  4  . omeprazole (PRILOSEC) 40 MG capsule Take 40 mg by mouth daily.     . Travoprost, BAK Free, (TRAVATAN) 0.004 % SOLN ophthalmic solution Place 1 drop into both eyes at bedtime.    . Vitamin D, Ergocalciferol, (DRISDOL) 50000 units CAPS capsule Take 50,000 Units by mouth every Monday.   5  . loperamide (IMODIUM A-D) 2 MG tablet Take 2 mg by mouth as needed for diarrhea or loose stools. (Patient not taking: Reported on 04/14/2020)     No current facility-administered medications for this visit.     PHYSICAL EXAMINATION: Performance status (ECOG): 1 - Symptomatic but completely ambulatory  Vitals:   04/14/20 0806  BP: (!) 148/70  Pulse: 89  Resp: 18  Temp: (!) 97.3 F (36.3 C)  SpO2: 97%   Wt Readings from Last 3 Encounters:  04/14/20 228 lb 12.8 oz (103.8 kg)  10/08/19 225 lb 4.8 oz (102.2 kg)  02/27/19 239 lb 9 oz (108.7 kg)   Physical Exam Vitals reviewed.  Constitutional:       Appearance: Normal appearance. She is obese.  Cardiovascular:     Rate and Rhythm: Normal rate and regular rhythm.     Pulses: Normal pulses.     Heart sounds: Normal heart sounds.  Pulmonary:     Effort: Pulmonary effort is normal.     Breath sounds: Normal breath sounds.  Chest:     Breasts:        Right: Tenderness (R upper outer quadrant lumpectomy scar tender) present. No swelling or skin change.   Neurological:     General: No focal deficit present.     Mental Status: She is alert and oriented to person, place, and time.  Psychiatric:        Mood and Affect: Mood normal.        Behavior: Behavior normal.     Breast Exam Chaperone: Milinda Antis, MD     LABORATORY DATA:  I have reviewed the data as listed CMP Latest Ref Rng & Units 04/07/2020 09/18/2019 02/20/2019  Glucose 70 - 99 mg/dL 133(H) 104(H) 110(H)  BUN 8 - 23 mg/dL 22 19 24(H)  Creatinine 0.44 - 1.00 mg/dL 0.78 0.81 0.69  Sodium 135 - 145 mmol/L 138 140 139  Potassium 3.5 - 5.1 mmol/L 3.7 4.1 4.3  Chloride 98 - 111 mmol/L 108 108 107  CO2 22 - 32 mmol/L 20(L) 26 22  Calcium 8.9 - 10.3 mg/dL 9.3 9.5 9.3  Total Protein 6.5 - 8.1 g/dL 6.6 7.3 7.5  Total Bilirubin 0.3 - 1.2 mg/dL 0.6 0.6 0.6  Alkaline Phos 38 - 126 U/L 46 49 51  AST 15 - 41 U/L _0 ALT 0 - 44 U/L _1 No results found for: TAV697 Lab Results  Component Value Date   WBC 5.8 04/07/2020   HGB 13.3 04/07/2020   HCT 40.9 04/07/2020   MCV 98.3 04/07/2020   PLT 254 04/07/2020   NEUTROABS 3.5 04/07/2020   Lab Results  Component Value Date   VD25OH 73.61 04/07/2020    ASSESSMENT:  1.  Stage IIa right breast TNBC: -Diagnosed on 05/21/2015, ER/PR/HER-2 negative, Ki-67 86%, grade 3 -Neoadjuvant chemotherapy with AC x4 followed by Taxol x2 cycles.  She developed reaction to Taxol and received Abraxane in place and completed on 10/28/2015. -Right lumpectomy on 12/12/2015 with complete pathological response, YPT0Y PN 0. -Germline  mutation testing was negative. -Mammogram reviewed on 09/18/2019 shows BI-RADS Category 2.  2.  Osteopenia: -Bone density test on 09/18/2019 shows T score of -1.6.  3.  Peripheral neuropathy: -Chemotherapy induced numbness in the hands.  Gabapentin cause drowsiness.   PLAN:  1.  Stage IIa right breast TNBC: -Physical examination today did not reveal any palpable masses.  There is thickening of the breast tissue in the upper quadrant above the nipple which is stable. -Reviewed labs from 04/07/2020 showed normal chemistries and CBC. -RTC 6 months with labs.  We will schedule mammogram in March 2022. -She reportedly was told that she had some abnormal spot on the kidney.  She will follow up with Luanna Salk, FNP/Dr. Calton Dach in [place].  2.  Osteopenia: -Continue vitamin D 50,000 units weekly.  Vitamin D level is 73.  3.  Peripheral neuropathy: -Continue Cymbalta 30 mg at bedtime.    Breast Cancer therapy associated bone loss: I have recommended calcium, Vitamin D and weight bearing exercises.   Orders Placed This Encounter  Procedures  . MM Digital Diagnostic Bilat    Standing Status:   Future    Standing Expiration Date:   04/14/2021    Order Specific Question:   Reason for Exam (SYMPTOM  OR DIAGNOSIS REQUIRED)    Answer:   hx breast cancer    Order Specific Question:   Preferred imaging location?    Answer:   [facility]    Order Specific Question:   Release to patient    Answer:   Immediate   The patient has a good understanding of the overall plan. she agrees with it. she will call with any problems that may develop before the next visit here.    Derek Jack, MD [place] [phone]   I, Milinda Antis, am acting as a scribe for Dr. Sanda Linger.  I, Derek Jack MD, have reviewed the above documentation for accuracy and completeness, and I agree with the above.

## 2020-05-27 ENCOUNTER — Ambulatory Visit: Payer: BC Managed Care – PPO | Admitting: Endocrinology

## 2020-05-27 ENCOUNTER — Encounter: Payer: Self-pay | Admitting: Endocrinology

## 2020-05-27 ENCOUNTER — Other Ambulatory Visit: Payer: Self-pay

## 2020-05-27 DIAGNOSIS — R61 Generalized hyperhidrosis: Secondary | ICD-10-CM

## 2020-05-27 DIAGNOSIS — E039 Hypothyroidism, unspecified: Secondary | ICD-10-CM | POA: Insufficient documentation

## 2020-05-27 LAB — T4, FREE: Free T4: 0.7 ng/dL (ref 0.60–1.60)

## 2020-05-27 LAB — TSH: TSH: 1.29 u[IU]/mL (ref 0.35–4.50)

## 2020-05-27 LAB — CORTISOL: Cortisol, Plasma: 5.5 ug/dL

## 2020-05-27 LAB — T3, FREE: T3, Free: 3.3 pg/mL (ref 2.3–4.2)

## 2020-05-27 NOTE — Progress Notes (Signed)
Subjective:    Patient ID: Betty Henry, female    DOB: [birth year], 64 y.o.   MRN: [id number]  HPI Pt is referred by Dr Calton Dach, for excessive sweating.  Pt reports 6 mos of excessive sweating--mostly on the head, but also throughout the body.  She describes this as very bothersome.  This does not require exertion.  she has no h/o HTN.  She has been on Cymbalta since early 2021, for painful PN.  she has no h/o adrenal disease, diabetes, migraine headache, MTC, alcohol withdrawal, neurofibromas, hemangiomas, brain aneurysm, cocaine use, and menopause.  She also has muscle cramps throughout the body.    Past Medical History:  Diagnosis Date  . Achilles tendon pain   . Anemia   . Arthritis    knees  . Breast cancer ([place])   . Cancer ([place])    right breast-already finished chemo  . Cataracts, bilateral   . Family history of bladder cancer   . Family history of stomach cancer   . GERD (gastroesophageal reflux disease)   . Glaucoma   . Hypothyroidism   . Neuropathy    FROM CHEMO     . Psoriasis    elbows, knees  . Psoriasis    bil legs, elbows and hands  . TFCC (triangular fibrocartilage complex) tear    left    Past Surgical History:  Procedure Laterality Date  . ABDOMINAL HYSTERECTOMY     partial  . BREAST LUMPECTOMY WITH RADIOACTIVE SEED AND SENTINEL LYMPH NODE BIOPSY Right 12/12/2015   Procedure: RIGHT BREAST LUMPECTOMY WITH RADIOACTIVE SEED AND SENTINEL LYMPH NODE BIOPSY;  Surgeon: Excell Seltzer, MD;  Location: [facility];  Service: General;  Laterality: Right;  . BREAST SURGERY    . BUNIONECTOMY Left   . CHOLECYSTECTOMY  09/2010  . CHOLECYSTECTOMY    . CYST EXCISION Left    wrist  . DILATION AND CURETTAGE OF UTERUS    . FOOT SURGERY  2004   left  . HAND TENDON SURGERY Left 2012  . KNEE ARTHROSCOPY Left    x2  . KNEE SURGERY  2000, 2008   left  . PARTIAL HYSTERECTOMY  1989  . PORT-A-CATH REMOVAL Left 12/12/2015   Procedure: REMOVAL PORT-A-CATH;   Surgeon: Excell Seltzer, MD;  Location: [facility];  Service: General;  Laterality: Left;  . PORTACATH PLACEMENT Left 06/27/2015   Procedure: INSERTION PORT-A-CATH;  Surgeon: Excell Seltzer, MD;  Location: [facility];  Service: General;  Laterality: Left;  . PTOSIS REPAIR Bilateral 10/21/2016   Procedure: INTERNAL PTOSIS REPAIR;  Surgeon: Clista Bernhardt, MD;  Location: [place];  Service: Plastics;  Laterality: Bilateral;  . WRIST ARTHROSCOPY  06/29/2011   Procedure: ARTHROSCOPY WRIST;  Surgeon: Tennis Must;  Location: [place];  Service: Orthopedics;  Laterality: Left;  left wrist tfcc repair    Social History   Socioeconomic History  . Marital status: Married    Spouse name: Not on file  . Number of children: Not on file  . Years of education: Not on file  . Highest education level: Not on file  Occupational History  . Not on file  Tobacco Use  . Smoking status: Never Smoker  . Smokeless tobacco: Never Used  Vaping Use  . Vaping Use: Never used  Substance and Sexual Activity  . Alcohol use: No  . Drug use: No  . Sexual activity: Yes    Comment: married  Other Topics Concern  .  Not on file  Social History Narrative   ** Merged History Encounter **       Social Determinants of Health   Financial Resource Strain:   . Difficulty of Paying Living Expenses: Not on file  Food Insecurity:   . Worried About Charity fundraiser in the Last Year: Not on file  . Ran Out of Food in the Last Year: Not on file  Transportation Needs:   . Lack of Transportation (Medical): Not on file  . Lack of Transportation (Non-Medical): Not on file  Physical Activity:   . Days of Exercise per Week: Not on file  . Minutes of Exercise per Session: Not on file  Stress:   . Feeling of Stress : Not on file  Social Connections:   . Frequency of Communication with Friends and Family: Not on file  . Frequency of Social Gatherings with Friends and  Family: Not on file  . Attends Religious Services: Not on file  . Active Member of Clubs or Organizations: Not on file  . Attends Archivist Meetings: Not on file  . Marital Status: Not on file  Intimate Partner Violence:   . Fear of Current or Ex-Partner: Not on file  . Emotionally Abused: Not on file  . Physically Abused: Not on file  . Sexually Abused: Not on file    Current Outpatient Medications on File Prior to Visit  Medication Sig Dispense Refill  . acetaminophen (TYLENOL) 500 MG tablet Take 1,000 mg by mouth 2 (two) times daily as needed for moderate pain or headache.     . clobetasol ointment (TEMOVATE) 0.05 % APPLY TO THE AFFECTED AREA(S) A SMALL AMOUNT TWICE DAILY AS NEEDED FOR TWO WEEKS ON, TWO WEEKS OFF. DO NOT APPLY TO FACE OR BETWEEN LEGS ARE    . cyanocobalamin 2000 MCG tablet Take 2,000 mcg by mouth daily.    . DULoxetine (CYMBALTA) 30 MG capsule TAKE ONE CAPSULE BY MOUTH DAILY, FOR THE first WEEK AND THEN TAKE TWO CAPSULES EVERY night 60 capsule 4  . levothyroxine (SYNTHROID, LEVOTHROID) 88 MCG tablet TAKE 1 TABLET BY MOUTH ON AN EMPTY STOMACH IN THE MORNING DAILY  4  . omeprazole (PRILOSEC) 40 MG capsule Take 40 mg by mouth daily.     . Travoprost, BAK Free, (TRAVATAN) 0.004 % SOLN ophthalmic solution Place 1 drop into both eyes at bedtime.    . Vitamin D, Ergocalciferol, (DRISDOL) 50000 units CAPS capsule Take 50,000 Units by mouth every Monday.   5  . Dietary Management Product (XYZBAC) TABS Take by mouth. (Patient not taking: Reported on 05/27/2020)     No current facility-administered medications on file prior to visit.    Allergies  Allergen Reactions  . Other Swelling    RAW GREEN PEPPERS THROAT SWELLING   . Paclitaxel Anaphylaxis    Due to cremaphor component most likely High fever  . Adhesive [Tape] Rash  . Amoxicillin Rash    "BAD RASH"  Has patient had a PCN reaction causing immediate rash, facial/tongue/throat swelling, SOB or  lightheadedness with hypotension: #  #  #  YES  #  #  #  Has patient had a PCN reaction causing severe rash involving mucus membranes or skin necrosis: No Has patient had a PCN reaction that required hospitalization No Has patient had a PCN reaction occurring within the last 10 years: #  #  #  YES  #  #  #  If all of the above answers  are "NO", then may proceed with Cephalosporin use.   . Codeine Nausea And Vomiting    Can take with nausea med  . Hydrocodone Nausea Only    States if she takes it she has to use phenergan also  . Meperidine Nausea And Vomiting    Family History  Problem Relation Age of Onset  . COPD Mother        d. 63  . Emphysema Mother   . Thyroid disease Mother   . Diabetes Father   . Hypertension Father   . Stroke Father        d. 71  . Kidney disease Sister        Stage III  . Thyroid disease Sister   . Cervical cancer Sister   . Cervical cancer Maternal Grandmother   . Heart attack Maternal Grandfather   . Stomach cancer Paternal Grandfather   . Bladder Cancer Paternal Aunt   . Adrenal disorder Neg Hx     BP 140/82   Pulse 78   Ht 5' 2.5" (1.588 m)   Wt 226 lb (102.5 kg)   SpO2 98%   BMI 40.68 kg/m    Review of Systems denies depression, constipation, tremor, headache, fever, hypoglycemia, and palpitations.  She has lost 20 lbs--intentional.        Objective:   Physical Exam VS: see vs page GEN: no distress HEAD: head: no deformity eyes: no periorbital swelling, no proptosis external nose and ears are normal NECK: supple, thyroid is not enlarged CHEST WALL: no deformity LUNGS: clear to auscultation CV: reg rate and rhythm, no murmur.  MUSCULOSKELETAL: gait is normal and steady.   EXTEMITIES: no deformity.  Trace bilat leg edema NEURO:  cn 2-12 grossly intact.   readily moves all 4's.  sensation is intact to touch on all 4's.  Slight tremor of the hands SKIN:  Normal texture and temperature.  Psoriasis is noted.  Not diaphoretic  now. NODES:  None palpable at the neck PSYCH: alert, well-oriented.  Does not appear anxious nor depressed.   outside test results are reviewed: TSH=normal  I have reviewed outside records, and summarized: Pt was noted to have above sxs, and referred here.   Depression, GERD, and psoriasis were also addressed.        Assessment & Plan:  Diaphoresis, new to me, uncertain etiology and prognosis PN, uncertain etiology and prognosis.  I advised pt she should continue Cymbalta, despite side effect of sweating  Patient Instructions  Blood and urine tests are requested for you today.  We'll let you know about the results.  Please do the 24-HR urine test at the [facility] in [place].  Due to the Cymbalta, we expect some of the blood tests to be slightly abnormal, but this is harmless--we are looking for more abnormal results.   I would be happy to see you back here as needed

## 2020-05-27 NOTE — Patient Instructions (Addendum)
Blood and urine tests are requested for you today.  We'll let you know about the results.  Please do the 24-HR urine test at the [facility] lab in [place].  Due to the Cymbalta, we expect some of the blood tests to be slightly abnormal, but this is harmless--we are looking for more abnormal results.   I would be happy to see you back here as needed

## 2020-06-02 ENCOUNTER — Telehealth: Payer: Self-pay

## 2020-06-02 NOTE — Telephone Encounter (Signed)
Fax sent to [facility] in [place]

## 2020-06-02 NOTE — Telephone Encounter (Signed)
New message   Seen on 11.9.2021  The patient is at [facility] now - waiting on order for 24 urine specimen.

## 2020-06-02 NOTE — Telephone Encounter (Signed)
Please see below, patient is waiting

## 2020-06-03 LAB — CATECHOLAMINES, FRACTIONATED, PLASMA
Dopamine: 13 pg/mL
Epinephrine: <20 pg/mL
Norepinephrine: 556 pg/mL
Total Catecholamines: 569 pg/mL

## 2020-06-03 LAB — METANEPHRINES, PLASMA
Metanephrine, Free: <25 pg/mL (ref <=57)
Normetanephrine, Free: 72 pg/mL (ref <=148)
Total Metanephrines-Plasma: 72 pg/mL (ref <=205)

## 2020-06-03 LAB — CALCITONIN: Calcitonin: <2 pg/mL (ref <=5)

## 2020-06-04 ENCOUNTER — Other Ambulatory Visit: Payer: Self-pay | Admitting: Endocrinology

## 2020-06-06 ENCOUNTER — Telehealth: Payer: Self-pay | Admitting: Endocrinology

## 2020-06-06 NOTE — Telephone Encounter (Addendum)
I have called patient and she stated that she wants to know about her 24 urine test. I have inform her that I do not see the lab order in the system. She stated that already completed at lab in [place]. Inform her it may takes some time. We will let her know when results are in.

## 2020-06-06 NOTE — Telephone Encounter (Signed)
New message  ° ° °Patient calling for test results.   °

## 2020-06-08 ENCOUNTER — Other Ambulatory Visit (HOSPITAL_COMMUNITY): Payer: Self-pay | Admitting: Hematology

## 2020-06-08 DIAGNOSIS — G620 Drug-induced polyneuropathy: Secondary | ICD-10-CM

## 2020-06-08 DIAGNOSIS — T451X5A Adverse effect of antineoplastic and immunosuppressive drugs, initial encounter: Secondary | ICD-10-CM

## 2020-06-10 LAB — CATECHOLAMINES, FRACTIONATED, URINE, 24 HOUR
Calc Total (E+NE): 41 mcg/24 h (ref 26–121)
Creatinine, Urine mg/day-CATEUR: 1.31 g/(24.h) (ref 0.50–2.15)
Dopamine 24 Hr Urine: 258 mcg/24 h (ref 52–480)
Norepinephrine, 24H, Ur: 41 mcg/24 h (ref 15–100)
Total Volume: 1000 mL

## 2020-06-10 LAB — METANEPHRINES, URINE, 24 HOUR
Metaneph Total, Ur: 452 mcg/24 h (ref 224–832)
Metanephrines, Ur: 97 mcg/24 h (ref 90–315)
Normetanephrine, 24H Ur: 355 mcg/24 h (ref 122–676)
Volume, Urine-VMAUR: 1000

## 2020-06-16 NOTE — Telephone Encounter (Signed)
Patient called back and stated she was returning what she thought ,ight have been a call back from this office.  In addition to her lab results patient also has questions about hair loss, vaginal dryness, and the feeling that something is happening hormonally.  Wants to know why hormones labs are not drawn for her?

## 2020-06-16 NOTE — Telephone Encounter (Signed)
Notified pt 24 HR urine results--Pt stated still worried about the symptoms might something to do with the hormones.and primary care aware of this problem. Please advise.

## 2020-06-16 NOTE — Telephone Encounter (Signed)
24-HR urine is normal.  We found no cause for these symptoms.  Please ask primary care provider.

## 2020-06-16 NOTE — Telephone Encounter (Signed)
Please advise 

## 2020-06-16 NOTE — Telephone Encounter (Signed)
Sorry, I can't come up with any other possibilities

## 2020-06-16 NOTE — Telephone Encounter (Signed)
Patient had her labs done in [place] she said ([facility]) - she is calling to check on status of results. Please advise. [phone]

## 2020-06-17 NOTE — Telephone Encounter (Signed)
Notified pt with Dr. Ellison message. 

## 2020-09-23 ENCOUNTER — Ambulatory Visit (HOSPITAL_COMMUNITY)
Admission: RE | Admit: 2020-09-23 | Discharge: 2020-09-23 | Disposition: A | Discharge status: Home / Self Care | Payer: BC Managed Care – PPO | Source: Ambulatory Visit | Attending: Hematology | Admitting: Hematology

## 2020-09-23 ENCOUNTER — Other Ambulatory Visit (HOSPITAL_COMMUNITY): Payer: BC Managed Care – PPO

## 2020-09-23 ENCOUNTER — Inpatient Hospital Stay (HOSPITAL_COMMUNITY): Payer: BC Managed Care – PPO | Attending: Hematology

## 2020-09-23 ENCOUNTER — Encounter (HOSPITAL_COMMUNITY): Payer: BC Managed Care – PPO

## 2020-09-23 ENCOUNTER — Inpatient Hospital Stay (HOSPITAL_COMMUNITY): Payer: BC Managed Care – PPO

## 2020-09-23 ENCOUNTER — Other Ambulatory Visit: Payer: Self-pay

## 2020-09-23 DIAGNOSIS — T451X5S Adverse effect of antineoplastic and immunosuppressive drugs, sequela: Secondary | ICD-10-CM | POA: Diagnosis not present

## 2020-09-23 DIAGNOSIS — R110 Nausea: Secondary | ICD-10-CM | POA: Insufficient documentation

## 2020-09-23 DIAGNOSIS — Z171 Estrogen receptor negative status [ER-]: Secondary | ICD-10-CM | POA: Diagnosis not present

## 2020-09-23 DIAGNOSIS — Z9221 Personal history of antineoplastic chemotherapy: Secondary | ICD-10-CM | POA: Diagnosis not present

## 2020-09-23 DIAGNOSIS — C50919 Malignant neoplasm of unspecified site of unspecified female breast: Secondary | ICD-10-CM | POA: Diagnosis present

## 2020-09-23 DIAGNOSIS — G620 Drug-induced polyneuropathy: Secondary | ICD-10-CM | POA: Insufficient documentation

## 2020-09-23 DIAGNOSIS — Z79899 Other long term (current) drug therapy: Secondary | ICD-10-CM | POA: Insufficient documentation

## 2020-09-23 DIAGNOSIS — R61 Generalized hyperhidrosis: Secondary | ICD-10-CM | POA: Insufficient documentation

## 2020-09-23 DIAGNOSIS — R519 Headache, unspecified: Secondary | ICD-10-CM | POA: Diagnosis not present

## 2020-09-23 DIAGNOSIS — R232 Flushing: Secondary | ICD-10-CM | POA: Insufficient documentation

## 2020-09-23 DIAGNOSIS — R351 Nocturia: Secondary | ICD-10-CM | POA: Insufficient documentation

## 2020-09-23 DIAGNOSIS — M8580 Other specified disorders of bone density and structure, unspecified site: Secondary | ICD-10-CM | POA: Insufficient documentation

## 2020-09-23 DIAGNOSIS — M7918 Myalgia, other site: Secondary | ICD-10-CM | POA: Insufficient documentation

## 2020-09-23 DIAGNOSIS — Z923 Personal history of irradiation: Secondary | ICD-10-CM | POA: Diagnosis not present

## 2020-09-23 DIAGNOSIS — C50911 Malignant neoplasm of unspecified site of right female breast: Secondary | ICD-10-CM | POA: Diagnosis not present

## 2020-09-23 DIAGNOSIS — R5383 Other fatigue: Secondary | ICD-10-CM | POA: Diagnosis not present

## 2020-09-23 LAB — COMPREHENSIVE METABOLIC PANEL
ALT: 26 U/L (ref 0–44)
AST: 23 U/L (ref 15–41)
Albumin: 4 g/dL (ref 3.5–5.0)
Alkaline Phosphatase: 54 U/L (ref 38–126)
Anion gap: 9 (ref 5–15)
BUN: 17 mg/dL (ref 8–23)
CO2: 24 mmol/L (ref 22–32)
Calcium: 9.4 mg/dL (ref 8.9–10.3)
Chloride: 106 mmol/L (ref 98–111)
Creatinine, Ser: 0.83 mg/dL (ref 0.44–1.00)
GFR, Estimated: >60 mL/min (ref >60)
Glucose, Bld: 128 mg/dL — ABNORMAL HIGH (ref 70–99)
Potassium: 4 mmol/L (ref 3.5–5.1)
Sodium: 139 mmol/L (ref 135–145)
Total Bilirubin: 0.7 mg/dL (ref 0.3–1.2)
Total Protein: 7.3 g/dL (ref 6.5–8.1)

## 2020-09-23 LAB — CBC WITH DIFFERENTIAL/PLATELET
Abs Immature Granulocytes: 0.01 10*3/uL (ref 0.00–0.07)
Basophils Absolute: 0.1 10*3/uL (ref 0.0–0.1)
Basophils Relative: 1 %
Eosinophils Absolute: 0.2 10*3/uL (ref 0.0–0.5)
Eosinophils Relative: 4 %
HCT: 41.2 % (ref 36.0–46.0)
Hemoglobin: 13.6 g/dL (ref 12.0–15.0)
Immature Granulocytes: 0 %
Lymphocytes Relative: 31 %
Lymphs Abs: 1.6 10*3/uL (ref 0.7–4.0)
MCH: 32 pg (ref 26.0–34.0)
MCHC: 33 g/dL (ref 30.0–36.0)
MCV: 96.9 fL (ref 80.0–100.0)
Monocytes Absolute: 0.4 10*3/uL (ref 0.1–1.0)
Monocytes Relative: 7 %
Neutro Abs: 2.8 10*3/uL (ref 1.7–7.7)
Neutrophils Relative %: 57 %
Platelets: 251 10*3/uL (ref 150–400)
RBC: 4.25 MIL/uL (ref 3.87–5.11)
RDW: 13.2 % (ref 11.5–15.5)
WBC: 5.1 10*3/uL (ref 4.0–10.5)
nRBC: 0 % (ref 0.0–0.2)

## 2020-09-23 LAB — VITAMIN D 25 HYDROXY (VIT D DEFICIENCY, FRACTURES): Vit D, 25-Hydroxy: 76.55 ng/mL (ref 30–100)

## 2020-09-23 MED ORDER — FULVESTRANT 250 MG/5ML IM SOLN
INTRAMUSCULAR | Status: AC
  Filled 2020-09-23: qty 10

## 2020-09-24 MED ORDER — LANREOTIDE ACETATE 120 MG/0.5ML ~~LOC~~ SOLN
SUBCUTANEOUS | Status: AC
  Filled 2020-09-24: qty 120

## 2020-09-25 MED ORDER — MAGNESIUM SULFATE 2 GM/50ML IV SOLN
INTRAVENOUS | Status: AC
  Filled 2020-09-25: qty 50

## 2020-09-25 MED ORDER — LANREOTIDE ACETATE 120 MG/0.5ML ~~LOC~~ SOLN
SUBCUTANEOUS | Status: AC
  Filled 2020-09-25: qty 120

## 2020-09-26 MED ORDER — PEGFILGRASTIM-CBQV 6 MG/0.6ML ~~LOC~~ SOSY
PREFILLED_SYRINGE | SUBCUTANEOUS | Status: AC
  Filled 2020-09-26: qty 0.6

## 2020-09-26 MED ORDER — OCTREOTIDE ACETATE 20 MG IM KIT
PACK | INTRAMUSCULAR | Status: AC
  Filled 2020-09-26: qty 1

## 2020-09-29 ENCOUNTER — Inpatient Hospital Stay (HOSPITAL_COMMUNITY): Payer: BC Managed Care – PPO | Admitting: Hematology

## 2020-09-29 ENCOUNTER — Other Ambulatory Visit: Payer: Self-pay

## 2020-09-29 ENCOUNTER — Encounter (HOSPITAL_COMMUNITY): Payer: Self-pay | Admitting: Hematology

## 2020-09-29 ENCOUNTER — Ambulatory Visit (HOSPITAL_COMMUNITY): Payer: BC Managed Care – PPO | Admitting: Hematology

## 2020-09-29 VITALS — BP 121/60 | HR 80 | Temp 98.5°F | Resp 16 | Wt 231.6 lb

## 2020-09-29 DIAGNOSIS — Z9221 Personal history of antineoplastic chemotherapy: Secondary | ICD-10-CM | POA: Diagnosis not present

## 2020-09-29 DIAGNOSIS — Z923 Personal history of irradiation: Secondary | ICD-10-CM | POA: Diagnosis not present

## 2020-09-29 DIAGNOSIS — C50911 Malignant neoplasm of unspecified site of right female breast: Secondary | ICD-10-CM

## 2020-09-29 DIAGNOSIS — G620 Drug-induced polyneuropathy: Secondary | ICD-10-CM | POA: Diagnosis not present

## 2020-09-29 DIAGNOSIS — N2889 Other specified disorders of kidney and ureter: Secondary | ICD-10-CM

## 2020-09-29 DIAGNOSIS — C50919 Malignant neoplasm of unspecified site of unspecified female breast: Secondary | ICD-10-CM

## 2020-09-29 DIAGNOSIS — T451X5A Adverse effect of antineoplastic and immunosuppressive drugs, initial encounter: Secondary | ICD-10-CM

## 2020-09-29 MED ORDER — GABAPENTIN 300 MG PO CAPS: 300.0000 mg | ORAL_CAPSULE | Freq: Three times a day (TID) | ORAL | 6 refills | Status: DC

## 2020-09-29 NOTE — Patient Instructions (Signed)
[place] at [facility] Discharge Instructions  You were seen today by Dr. Delton Coombes. He went over your recent results and scans. You will be scheduled to have a CT scan of your abdomen done to follow-up on your kidney mass before your next visit. You will be prescribed gabapentin to take 300 mg twice to three times daily. Purchase calcium over the counter and take 1,000 mg daily. Dr. Delton Coombes will see you back in 6 months for labs and follow up.   Thank you for choosing [place] at [facility] to provide your oncology and hematology care.  To afford each patient quality time with our provider, please arrive at least 15 minutes before your scheduled appointment time.   If you have a lab appointment with the [facility] please come in thru the Main Entrance and check in at the main information desk  You need to re-schedule your appointment should you arrive 10 or more minutes late.  We strive to give you quality time with our providers, and arriving late affects you and other patients whose appointments are after yours.  Also, if you no show three or more times for appointments you may be dismissed from the clinic at the providers discretion.     Again, thank you for choosing [facility].  Our hope is that these requests will decrease the amount of time that you wait before being seen by our physicians.       _____________________________________________________________  Should you have questions after your visit to [facility], please contact our office at [phone] between the hours of 8:00 a.m. and 4:30 p.m.  Voicemails left after 4:00 p.m. will not be returned until the following business day.  For prescription refill requests, have your pharmacy contact our office and allow 72 hours.    Cancer Center Support Programs:   > Cancer Support Group  2nd Tuesday of the month 1pm-2pm, Journey Room

## 2020-09-29 NOTE — Progress Notes (Signed)
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 - DUCTAL CARCINOMA IN SITU. - SEE COMMENT. Microscopic Comment The carcinoma appears grade 3. Per outside report, the tumor cells are negative for estrogen receptor, progesterone receptor and Her 2 neu. Ki-67 is 86%. (JBK:ds 06/17/15) JOSHUA KISH MD   06/20/2015 Imaging   MRI breast Right breast: In the upper-outer quadrant posterior 1/3 depth right breast, there is a 2.1 x 1.3 x 1.4 cm spiculated enhancing mass with washout enhancement kinetics and associated biopsy clip consistent with patient's known cancer.   06/30/2015 - 08/19/2015 Chemotherapy   Dose Dense AC x 4    09/02/2015 - 10/28/2015 Chemotherapy   Dose Dense Taxol X 2, reaction, then changed to abraxane    11/03/2015 Imaging   MRI breast Complete imaging response to neoadjuvant chemotherapy. No mass or abnormal enhancement is seen today.   12/12/2015 Surgery   RIGHT BREAST LUMPECTOMY WITH RADIOACTIVE SEED AND SENTINEL LYMPH NODE BIOPSY REMOVAL PORT-A-CATH, Dr. Excell Seltzer   12/12/2015 Pathology Results   Breast, lumpectomy, Right - FIBROSIS, HEMOSIDERIN DEPOSITION AND FOCAL GIANT CELL REACTION. - NO RESIDUAL TUMOR. - MARGINS NOT INVOLVED. 2. Lymph node, sentinel, biopsy, Right axillary #1 - ONE BENIGN LYMPH NODE (0/1). 3. Lymph node, sentinel, biopsy, Right axillary #2 - ONE BENIGN LYMPH NODE (0/1). 4. Lymph node, sentinel, biopsy, Right axillary #3 - ONE BENIGN LYMPH NODE (0/1). 5. Lymph node, sentinel, biopsy, Right axillary #4 - ONE BENIGN LYMPH NODE (0/1).   08/03/2016 Mammogram   IMPRESSION: Lumpectomy and radiation changes of the right breast. No evidence of malignancy in  either breast.   10/23/2018 Genetic Testing   Negative genetic testing on the common hereditary cancer panel.  The Hereditary Gene Panel offered by [facility] includes sequencing and/or deletion duplication testing of the following 48 genes: APC, ATM, AXIN2, BARD1, BMPR1A, BRCA1, BRCA2, BRIP1, CDH1, CDK4, CDKN2A (p14ARF), CDKN2A (p16INK4a), CHEK2, CTNNA1, DICER1, EPCAM (Deletion/duplication testing only), GREM1 (promoter region deletion/duplication testing only), KIT, MEN1, MLH1, MSH2, MSH3, MSH6, MUTYH, NBN, NF1, NHTL1, PALB2, PDGFRA, PMS2, POLD1, POLE, PTEN, RAD50, RAD51C, RAD51D, RNF43, SDHB, SDHC, SDHD, SMAD4, SMARCA4. STK11, TP53, TSC1, TSC2, and VHL.  The following genes were evaluated for sequence changes only: SDHA and HOXB13 c.251G>A variant only. The report date is October 23, 2018.     CHIEF COMPLIANT: Follow up for right breast cancer and chemo-induced neuropathy   INTERVAL HISTORY: Betty Henry is a 65 y.o. female here today for follow up of her right breast cancer and chemo-induced neuropathy. Her last visit was on 04/14/2020.   Today she reports feeling fair. She complains of having night sweats due to hot flashes and also has pain in her calves bilaterally. She continues taking Cymbalta and wakes up every morning with a headache, for which she takes Tylenol PRN. She has constant numbness and burning pain in her hands and feet; she used to take gabapentin 300 mg TID. She also complains of having pain in her bilateral calves. She is taking vitamin D once a week but is not taking calcium.   REVIEW OF SYSTEMS:   Review of Systems  Constitutional: Positive for fatigue (25%). Negative for appetite change.  Gastrointestinal: Positive for nausea.  Endocrine: Positive for hot  flashes.  Genitourinary: Positive for nocturia.   Musculoskeletal: Positive for myalgias (bilat calves).  Neurological: Positive for headaches (in AM d/t Cymbalta) and numbness (hands & feet).  All other systems  reviewed and are negative.   I have reviewed the past medical history, past surgical history, social history and family history with the patient and they are unchanged from previous note.   ALLERGIES:   is allergic to other, paclitaxel, camphor, adhesive [tape], amoxicillin, codeine, hydrocodone, and meperidine.   MEDICATIONS:  Current Outpatient Medications  Medication Sig Dispense Refill  . gabapentin (NEURONTIN) 300 MG capsule Take 1 capsule (300 mg total) by mouth 3 (three) times daily. 90 capsule 6  . acetaminophen (TYLENOL) 500 MG tablet Take 1,000 mg by mouth 2 (two) times daily as needed for moderate pain or headache.     . clobetasol ointment (TEMOVATE) 0.05 % APPLY TO THE AFFECTED AREA(S) A SMALL AMOUNT TWICE DAILY AS NEEDED FOR TWO WEEKS ON, TWO WEEKS OFF. DO NOT APPLY TO FACE OR BETWEEN LEGS ARE    . cyanocobalamin 2000 MCG tablet Take 2,000 mcg by mouth daily.    . DULoxetine (CYMBALTA) 30 MG capsule TAKE ONE CAPSULE BY MOUTH DAILY, FOR THE first WEEK AND THEN TAKE TWO CAPSULES EVERY night 60 capsule 4  . levothyroxine (SYNTHROID, LEVOTHROID) 88 MCG tablet TAKE 1 TABLET BY MOUTH ON AN EMPTY STOMACH IN THE MORNING DAILY  4  . omeprazole (PRILOSEC) 40 MG capsule Take 40 mg by mouth daily.     . Travoprost, BAK Free, (TRAVATAN) 0.004 % SOLN ophthalmic solution Place 1 drop into both eyes at bedtime.    . Vitamin D, Ergocalciferol, (DRISDOL) 50000 units CAPS capsule Take 50,000 Units by mouth every Monday.   5   No current facility-administered medications for this visit.     PHYSICAL EXAMINATION: Performance status (ECOG): 1 - Symptomatic but completely ambulatory  Vitals:   09/29/20 1140  BP: 121/60  Pulse: 80  Resp: 16  Temp: 98.5 F (36.9 C)  SpO2: 98%   Wt Readings from Last 3 Encounters:  09/29/20 231 lb 9.6 oz (105.1 kg)  05/27/20 226 lb (102.5 kg)  04/14/20 228 lb 12.8 oz (103.8 kg)   Physical Exam Vitals reviewed.  Constitutional:      Appearance:  Normal appearance. She is obese.  Cardiovascular:     Rate and Rhythm: Normal rate and regular rhythm.     Pulses: Normal pulses.     Heart sounds: Normal heart sounds.  Pulmonary:     Effort: Pulmonary effort is normal.     Breath sounds: Normal breath sounds.  Chest:  Breasts:     Right: Tenderness present. No bleeding, inverted nipple, mass, nipple discharge, skin change (lumpectomy scar stable) or axillary adenopathy.     Left: Normal. No bleeding, inverted nipple, mass, nipple discharge, skin change or tenderness.    Abdominal:     Palpations: Abdomen is soft. There is no hepatomegaly or mass.     Tenderness: There is no abdominal tenderness.  Musculoskeletal:     Right lower leg: No edema.     Left lower leg: No edema.  Lymphadenopathy:     Cervical: No cervical adenopathy.     Upper Body:     Right upper body: No axillary or pectoral adenopathy.  Neurological:     General: No focal deficit present.     Mental Status: She is alert and oriented to person, place, and time.  Psychiatric:  Mood and Affect: Mood normal.        Behavior: Behavior normal.     Breast Exam Chaperone: Milinda Antis, MD     LABORATORY DATA:  I have reviewed the data as listed CMP Latest Ref Rng & Units 09/23/2020 04/07/2020 09/18/2019  Glucose 70 - 99 mg/dL 128(H) 133(H) 104(H)  BUN 8 - 23 mg/dL 17 22 19   Creatinine 0.44 - 1.00 mg/dL 0.83 0.78 0.81  Sodium 135 - 145 mmol/L 139 138 140  Potassium 3.5 - 5.1 mmol/L 4.0 3.7 4.1  Chloride 98 - 111 mmol/L 106 108 108  CO2 22 - 32 mmol/L 24 20(L) 26  Calcium 8.9 - 10.3 mg/dL 9.4 9.3 9.5  Total Protein 6.5 - 8.1 g/dL 7.3 6.6 7.3  Total Bilirubin 0.3 - 1.2 mg/dL 0.7 0.6 0.6  Alkaline Phos 38 - 126 U/L 54 46 49  AST 15 - 41 U/L 23 19 23   ALT 0 - 44 U/L 26 24 29    No results found for: [id number] Lab Results  Component Value Date   WBC 5.1 09/23/2020   HGB 13.6 09/23/2020   HCT 41.2 09/23/2020   MCV 96.9 09/23/2020   PLT 251 09/23/2020    NEUTROABS 2.8 09/23/2020   Lab Results  Component Value Date   VD25OH 76.55 09/23/2020   VD25OH 73.61 04/07/2020    ASSESSMENT:  1. Stage IIa right breast TNBC: -Diagnosed on 05/21/2015, ER/PR/HER-2 negative, Ki-67 86%, grade 3 -Neoadjuvant chemotherapy with AC x4 followed by Taxol x2 cycles. She developed reaction to Taxol and received Abraxane in place and completed on 10/28/2015. -Right lumpectomy on 12/12/2015 with complete pathological response, YPT0 YPN 0. -Germline mutation testing was negative. -Mammogram reviewed on 09/18/2019 shows BI-RADS Category 2.  2. Osteopenia: -Bone density test on 09/18/2019 shows T score of -1.6.  3. Peripheral neuropathy: -Chemotherapy induced numbness in the hands.  Gabapentin cause drowsiness.   PLAN:  1. Stage IIa right breast TNBC: -Physical examination today did not reveal any palpable masses.  Thickening of the breast tissue in the upper quadrant above the nipple also stable.  Lumpectomy scar is stable. -Reviewed mammogram from 09/23/2020, BI-RADS Category 2.  Oil cyst at 10:00 in the right breast measures 1.1 x 0.9 x 0.8 cm. -Reviewed labs from 09/23/2020 which showed normal chemistries and CBC. -Her glucose was 128, fasting.  I have recommended her to have a hemoglobin A1c checked with her primary doctor. -RTC 6 months with labs.  2. Osteopenia: -Vitamin D level is 76.  Continue vitamin D 50,000 units weekly.  3. Peripheral neuropathy: -She could not tolerate Cymbalta. -We will start her on gabapentin 300 mg 2-3 times a day.   Breast Cancer therapy associated bone loss: I have recommended calcium, Vitamin D and weight bearing exercises.  I spent 25 minutes talking to the patient of which more than half was spent in counseling and coordination of care.  Orders Placed This Encounter  Procedures  . CT ABDOMEN W WO CONTRAST    Kidney lesion    Standing Status:   Future    Standing Expiration Date:   09/29/2021    Order Specific  Question:   If indicated for the ordered procedure, I authorize the administration of contrast media per Radiology protocol    Answer:   Yes    Order Specific Question:   Preferred imaging location?    Answer:   [facility]    Order Specific Question:   Release to patient  Answer:   Immediate    Order Specific Question:   Is Oral Contrast requested for this exam?    Answer:   Yes, Per Radiology protocol   The patient has a good understanding of the overall plan. she agrees with it. she will call with any problems that may develop before the next visit here.    Derek Jack, MD [place] [phone]   I, Milinda Antis, am acting as a scribe for Dr. Sanda Linger.  I, Derek Jack MD, have reviewed the above documentation for accuracy and completeness, and I agree with the above.

## 2020-10-06 ENCOUNTER — Encounter (HOSPITAL_COMMUNITY): Payer: Self-pay

## 2020-10-06 NOTE — Progress Notes (Signed)
Patient called reporting that the gabapentin makes her sick and she is going to switch back to Cymbalta. MD aware.

## 2020-11-03 ENCOUNTER — Other Ambulatory Visit (HOSPITAL_COMMUNITY): Payer: Self-pay | Admitting: Hematology

## 2020-11-03 DIAGNOSIS — G620 Drug-induced polyneuropathy: Secondary | ICD-10-CM

## 2020-12-19 ENCOUNTER — Telehealth: Payer: Self-pay | Admitting: Endocrinology

## 2020-12-19 NOTE — Telephone Encounter (Signed)
New message    Patient is requesting a copy of recent lab results

## 2020-12-22 NOTE — Telephone Encounter (Signed)
Patient called again re: Patient requests to have copies of her most recent labs sent to Patient at address:  [address] [place] [address]

## 2020-12-24 ENCOUNTER — Encounter: Payer: Self-pay | Admitting: *Deleted

## 2020-12-24 NOTE — Telephone Encounter (Signed)
Spoken to patient and patient requests from 05/2020. Printed and sent out

## 2021-04-01 ENCOUNTER — Encounter (HOSPITAL_COMMUNITY): Payer: Self-pay | Admitting: Radiology

## 2021-04-01 ENCOUNTER — Inpatient Hospital Stay (HOSPITAL_COMMUNITY): Payer: BC Managed Care – PPO | Attending: Hematology

## 2021-04-01 ENCOUNTER — Ambulatory Visit (HOSPITAL_COMMUNITY)
Admission: RE | Admit: 2021-04-01 | Discharge: 2021-04-01 | Disposition: A | Discharge status: Home / Self Care | Payer: BC Managed Care – PPO | Source: Ambulatory Visit | Attending: Hematology | Admitting: Hematology

## 2021-04-01 ENCOUNTER — Other Ambulatory Visit: Payer: Self-pay

## 2021-04-01 DIAGNOSIS — C50919 Malignant neoplasm of unspecified site of unspecified female breast: Secondary | ICD-10-CM

## 2021-04-01 DIAGNOSIS — M8580 Other specified disorders of bone density and structure, unspecified site: Secondary | ICD-10-CM | POA: Insufficient documentation

## 2021-04-01 DIAGNOSIS — R109 Unspecified abdominal pain: Secondary | ICD-10-CM | POA: Insufficient documentation

## 2021-04-01 DIAGNOSIS — G620 Drug-induced polyneuropathy: Secondary | ICD-10-CM | POA: Diagnosis not present

## 2021-04-01 DIAGNOSIS — Z9221 Personal history of antineoplastic chemotherapy: Secondary | ICD-10-CM | POA: Insufficient documentation

## 2021-04-01 DIAGNOSIS — R0602 Shortness of breath: Secondary | ICD-10-CM | POA: Insufficient documentation

## 2021-04-01 DIAGNOSIS — Z171 Estrogen receptor negative status [ER-]: Secondary | ICD-10-CM | POA: Diagnosis not present

## 2021-04-01 DIAGNOSIS — N2889 Other specified disorders of kidney and ureter: Secondary | ICD-10-CM

## 2021-04-01 DIAGNOSIS — Z79899 Other long term (current) drug therapy: Secondary | ICD-10-CM | POA: Insufficient documentation

## 2021-04-01 DIAGNOSIS — R5383 Other fatigue: Secondary | ICD-10-CM | POA: Insufficient documentation

## 2021-04-01 DIAGNOSIS — C50411 Malignant neoplasm of upper-outer quadrant of right female breast: Secondary | ICD-10-CM | POA: Insufficient documentation

## 2021-04-01 LAB — CBC WITH DIFFERENTIAL/PLATELET
Abs Immature Granulocytes: 0.01 10*3/uL (ref 0.00–0.07)
Basophils Absolute: 0.1 10*3/uL (ref 0.0–0.1)
Basophils Relative: 1 %
Eosinophils Absolute: 0.2 10*3/uL (ref 0.0–0.5)
Eosinophils Relative: 3 %
HCT: 42.8 % (ref 36.0–46.0)
Hemoglobin: 14 g/dL (ref 12.0–15.0)
Immature Granulocytes: 0 %
Lymphocytes Relative: 26 %
Lymphs Abs: 1.7 10*3/uL (ref 0.7–4.0)
MCH: 32.2 pg (ref 26.0–34.0)
MCHC: 32.7 g/dL (ref 30.0–36.0)
MCV: 98.4 fL (ref 80.0–100.0)
Monocytes Absolute: 0.6 10*3/uL (ref 0.1–1.0)
Monocytes Relative: 9 %
Neutro Abs: 3.9 10*3/uL (ref 1.7–7.7)
Neutrophils Relative %: 61 %
Platelets: 296 10*3/uL (ref 150–400)
RBC: 4.35 MIL/uL (ref 3.87–5.11)
RDW: 12.9 % (ref 11.5–15.5)
WBC: 6.4 10*3/uL (ref 4.0–10.5)
nRBC: 0 % (ref 0.0–0.2)

## 2021-04-01 LAB — COMPREHENSIVE METABOLIC PANEL
ALT: 26 U/L (ref 0–44)
AST: 21 U/L (ref 15–41)
Albumin: 4.2 g/dL (ref 3.5–5.0)
Alkaline Phosphatase: 55 U/L (ref 38–126)
Anion gap: 5 (ref 5–15)
BUN: 23 mg/dL (ref 8–23)
CO2: 25 mmol/L (ref 22–32)
Calcium: 9.1 mg/dL (ref 8.9–10.3)
Chloride: 108 mmol/L (ref 98–111)
Creatinine, Ser: 0.89 mg/dL (ref 0.44–1.00)
GFR, Estimated: >60 mL/min (ref >60)
Glucose, Bld: 113 mg/dL — ABNORMAL HIGH (ref 70–99)
Potassium: 4.2 mmol/L (ref 3.5–5.1)
Sodium: 138 mmol/L (ref 135–145)
Total Bilirubin: 0.7 mg/dL (ref 0.3–1.2)
Total Protein: 7.5 g/dL (ref 6.5–8.1)

## 2021-04-01 MED ORDER — IOHEXOL 350 MG/ML SOLN
100.0000 mL | Freq: Once | INTRAVENOUS | Status: AC | PRN
  Administered 2021-04-01: 80 mL via INTRAVENOUS

## 2021-04-05 NOTE — Progress Notes (Signed)
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 - DUCTAL CARCINOMA IN SITU. - SEE COMMENT. Microscopic Comment The carcinoma appears grade 3. Per outside report, the tumor cells are negative for estrogen receptor, progesterone receptor and Her 2 neu. Ki-67 is 86%. (JBK:ds 06/17/15) Betty KISH MD   06/20/2015 Imaging   MRI breast Right breast: In the upper-outer quadrant posterior 1/3 depth right breast, there is a 2.1 x 1.3 x 1.4 cm spiculated enhancing mass with washout enhancement kinetics and associated biopsy clip consistent with patient's known cancer.   06/30/2015 - 08/19/2015 Chemotherapy   Dose Dense AC x 4    09/02/2015 - 10/28/2015 Chemotherapy   Dose Dense Taxol X 2, reaction, then changed to abraxane    11/03/2015 Imaging   MRI breast Complete imaging response to neoadjuvant chemotherapy. No mass or abnormal enhancement is seen today.   12/12/2015 Surgery   RIGHT BREAST LUMPECTOMY WITH RADIOACTIVE SEED AND SENTINEL LYMPH NODE BIOPSY REMOVAL PORT-A-CATH, Dr. Excell Henry   12/12/2015 Pathology Results   Breast, lumpectomy, Right - FIBROSIS, HEMOSIDERIN DEPOSITION AND FOCAL GIANT CELL REACTION. - NO RESIDUAL TUMOR. - MARGINS NOT INVOLVED. 2. Lymph node, sentinel, biopsy, Right axillary #1 - ONE BENIGN LYMPH NODE (0/1). 3. Lymph node, sentinel, biopsy, Right axillary #2 - ONE BENIGN LYMPH NODE (0/1). 4. Lymph node, sentinel, biopsy, Right axillary #3 - ONE BENIGN LYMPH NODE (0/1). 5. Lymph node, sentinel, biopsy, Right axillary #4 - ONE BENIGN LYMPH NODE (0/1).   08/03/2016 Mammogram   IMPRESSION: Lumpectomy and radiation changes of the right breast. No evidence of malignancy in  either breast.   10/23/2018 Genetic Testing   Negative genetic testing on the common hereditary cancer panel.  The Hereditary Gene Panel offered by [facility] includes sequencing and/or deletion duplication testing of the following 48 genes: APC, ATM, AXIN2, BARD1, BMPR1A, BRCA1, BRCA2, BRIP1, CDH1, CDK4, CDKN2A (p14ARF), CDKN2A (p16INK4a), CHEK2, CTNNA1, DICER1, EPCAM (Deletion/duplication testing only), GREM1 (promoter region deletion/duplication testing only), KIT, MEN1, MLH1, MSH2, MSH3, MSH6, MUTYH, NBN, NF1, NHTL1, PALB2, PDGFRA, PMS2, POLD1, POLE, PTEN, RAD50, RAD51C, RAD51D, RNF43, SDHB, SDHC, SDHD, SMAD4, SMARCA4. STK11, TP53, TSC1, TSC2, and VHL.  The following genes were evaluated for sequence changes only: SDHA and HOXB13 c.251G>A variant only. The report date is October 23, 2018.     CHIEF COMPLIANT: Follow up for right breast cancer and chemo-induced neuropathy   INTERVAL HISTORY: Betty Henry is a 65 y.o. female here today for follow up of her right breast cancer. Her last visit was on 09/29/2020.   Today she reports feeling well. She is taking 30 mg Cymbalta daily which has improved her neuropathy. She reports increased memory loss. She reports sharp pain in her upper right abdomen while bending over, and she reports pain in her left back. She reports rash on her legs and arms.  REVIEW OF SYSTEMS:   Review of Systems  Constitutional:  Positive for fatigue (25%). Negative for appetite change.  Respiratory:  Positive for shortness of breath (with exertion).   Gastrointestinal:  Positive for abdominal pain (R side).  Musculoskeletal:  Positive for back pain.  Skin:  Positive for rash (legs and arms).  Neurological:  Positive for numbness (tingling in hands and feet).  Psychiatric/Behavioral:  Positive for decreased concentration.   All other systems reviewed and are negative.  I have reviewed the past medical history, past surgical history, social history and family history with the  patient and they are unchanged from previous note.   ALLERGIES:   is allergic to other, paclitaxel, camphor, adhesive [tape], amoxicillin, codeine, hydrocodone, and meperidine.   MEDICATIONS:  Current Outpatient Medications  Medication Sig Dispense Refill   acetaminophen (TYLENOL) 500 MG tablet Take 1,000 mg by mouth 2 (two) times daily as needed for moderate pain or headache.      clobetasol ointment (TEMOVATE) 0.05 % APPLY TO THE AFFECTED AREA(S) A SMALL AMOUNT TWICE DAILY AS NEEDED FOR TWO WEEKS ON, TWO WEEKS OFF. DO NOT APPLY TO FACE OR BETWEEN LEGS ARE     cyanocobalamin 2000 MCG tablet Take 2,000 mcg by mouth daily.     DULoxetine (CYMBALTA) 30 MG capsule TAKE ONE CAPSULE BY MOUTH DAILY, FOR THE first WEEK AND THEN TAKE TWO CAPSULES EVERY night 60 capsule 4   gabapentin (NEURONTIN) 300 MG capsule Take 1 capsule (300 mg total) by mouth 3 (three) times daily. 90 capsule 6   levothyroxine (SYNTHROID, LEVOTHROID) 88 MCG tablet TAKE 1 TABLET BY MOUTH ON AN EMPTY STOMACH IN THE MORNING DAILY  4   omeprazole (PRILOSEC) 40 MG capsule Take 40 mg by mouth daily.      Travoprost, BAK Free, (TRAVATAN) 0.004 % SOLN ophthalmic solution Place 1 drop into both eyes at bedtime.     Vitamin D, Ergocalciferol, (DRISDOL) 50000 units CAPS capsule Take 50,000 Units by mouth every Monday.   5   No current facility-administered medications for this visit.     PHYSICAL EXAMINATION: Performance status (ECOG): 1 - Symptomatic but completely ambulatory  There were no vitals filed for this visit. Wt Readings from Last 3 Encounters:  09/29/20 231 lb 9.6 oz (105.1 kg)  05/27/20 226 lb (102.5 kg)  04/14/20 228 lb 12.8 oz (103.8 kg)   Physical Exam Vitals reviewed.  Constitutional:      Appearance: Normal appearance. She is obese.  Cardiovascular:     Rate and Rhythm: Normal rate and regular rhythm.     Pulses: Normal pulses.     Heart sounds: Normal heart sounds.  Pulmonary:     Effort: Pulmonary  effort is normal.     Breath sounds: Normal breath sounds.  Neurological:     General: No focal deficit present.     Mental Status: She is alert and oriented to person, place, and time.  Psychiatric:        Mood and Affect: Mood normal.        Behavior: Behavior normal.    Breast Exam Chaperone: Betty Henry     LABORATORY DATA:  I have reviewed the data as listed CMP Latest Ref Rng & Units 04/01/2021 09/23/2020 04/07/2020  Glucose 70 - 99 mg/dL 113(H) 128(H) 133(H)  BUN 8 - 23 mg/dL 23 17 22   Creatinine 0.44 - 1.00 mg/dL 0.89 0.83 0.78  Sodium 135 - 145 mmol/L 138 139 138  Potassium 3.5 - 5.1 mmol/L 4.2 4.0 3.7  Chloride 98 - 111 mmol/L 108 106 108  CO2 22 - 32 mmol/L 25 24 20(L)  Calcium 8.9 - 10.3 mg/dL 9.1 9.4 9.3  Total Protein 6.5 - 8.1 g/dL 7.5 7.3 6.6  Total Bilirubin 0.3 - 1.2 mg/dL 0.7 0.7 0.6  Alkaline Phos 38 - 126 U/L 55 54 46  AST 15 - 41 U/L 21  23 19  ALT 0 - 44 U/L 26 26 24    No results found for: [id number] Lab Results  Component Value Date   WBC 6.4 04/01/2021   HGB 14.0 04/01/2021   HCT 42.8 04/01/2021   MCV 98.4 04/01/2021   PLT 296 04/01/2021   NEUTROABS 3.9 04/01/2021    ASSESSMENT:  1.  Stage IIa right breast TNBC: -Diagnosed on 05/21/2015, ER/PR/HER-2 negative, Ki-67 86%, grade 3 -Neoadjuvant chemotherapy with AC x4 followed by Taxol x2 cycles.  She developed reaction to Taxol and received Abraxane in place and completed on 10/28/2015. -Right lumpectomy on 12/12/2015 with complete pathological response, YPT0 YPN 0. -Germline mutation testing was negative. -Mammogram reviewed on 09/18/2019 shows BI-RADS Category 2.   2.  Osteopenia: -Bone density test on 09/18/2019 shows T score of -1.6.   3.  Peripheral neuropathy: -Chemotherapy induced numbness in the hands.  Gabapentin cause drowsiness.   PLAN:  1.  Stage IIa right breast TNBC: - Breast exam was not performed today. - Last mammogram on 09/23/2020, BI-RADS Category 2. - Reviewed labs from 04/01/2021  which showed normal LFTs and CBC. - RTC 6 months after mammogram in March.  After that I will switch her to every yearly visits. - She reports that she is planning to start [place] or [place] for psoriasis.   2.  Osteopenia: - Continue vitamin D 50,000 units weekly.   3.  Peripheral neuropathy: -She is taking Cymbalta 30 mg daily which is helping.  She does report occasional "crazy dreams".  4.  Right kidney lesion: - She reportedly had a right kidney lesion on the CT scan done at [facility] - We reviewed CT abdomen with and without contrast from 04/01/2021 which showed nonenhancing benign cystic lesion in the mid region of the right kidney.  Incidental hepatic steatosis.  No further testing needed.  Breast Cancer therapy associated bone loss: I have recommended calcium, Vitamin D and weight bearing exercises.  Orders placed this encounter:  No orders of the defined types were placed in this encounter.   The patient has a good understanding of the overall plan. She agrees with it. She will call with any problems that may develop before the next visit here.  Betty Jack, MD [place] [phone]   I, Betty Henry, am acting as a scribe for Dr. Derek Henry.  I, Betty Jack MD, have reviewed the above documentation for accuracy and completeness, and I agree with the above.

## 2021-04-06 ENCOUNTER — Other Ambulatory Visit: Payer: Self-pay

## 2021-04-06 ENCOUNTER — Other Ambulatory Visit (HOSPITAL_COMMUNITY): Payer: Self-pay | Admitting: Hematology

## 2021-04-06 ENCOUNTER — Inpatient Hospital Stay (HOSPITAL_COMMUNITY): Payer: BC Managed Care – PPO | Admitting: Hematology

## 2021-04-06 VITALS — BP 141/85 | HR 79 | Temp 97.9°F | Resp 18 | Wt 236.2 lb

## 2021-04-06 DIAGNOSIS — C50919 Malignant neoplasm of unspecified site of unspecified female breast: Secondary | ICD-10-CM

## 2021-04-06 DIAGNOSIS — N2889 Other specified disorders of kidney and ureter: Secondary | ICD-10-CM | POA: Diagnosis not present

## 2021-04-06 DIAGNOSIS — G620 Drug-induced polyneuropathy: Secondary | ICD-10-CM | POA: Diagnosis not present

## 2021-04-06 DIAGNOSIS — E559 Vitamin D deficiency, unspecified: Secondary | ICD-10-CM

## 2021-04-06 DIAGNOSIS — C50411 Malignant neoplasm of upper-outer quadrant of right female breast: Secondary | ICD-10-CM | POA: Diagnosis not present

## 2021-04-06 DIAGNOSIS — T451X5A Adverse effect of antineoplastic and immunosuppressive drugs, initial encounter: Secondary | ICD-10-CM

## 2021-04-06 NOTE — Patient Instructions (Addendum)
[place] at [facility] Discharge Instructions  You were seen today by Dr. Delton Coombes. He went over your recent results and scans. You will be scheduled for a mammogram after 09/23/2020. Dr. Delton Coombes will see you back in 6 months for labs and follow up.   Thank you for choosing [place] at [facility] to provide your oncology and hematology care.  To afford each patient quality time with our provider, please arrive at least 15 minutes before your scheduled appointment time.   If you have a lab appointment with the [facility] please come in thru the Main Entrance and check in at the main information desk  You need to re-schedule your appointment should you arrive 10 or more minutes late.  We strive to give you quality time with our providers, and arriving late affects you and other patients whose appointments are after yours.  Also, if you no show three or more times for appointments you may be dismissed from the clinic at the providers discretion.     Again, thank you for choosing [facility].  Our hope is that these requests will decrease the amount of time that you wait before being seen by our physicians.       _____________________________________________________________  Should you have questions after your visit to [facility], please contact our office at [phone] between the hours of 8:00 a.m. and 4:30 p.m.  Voicemails left after 4:00 p.m. will not be returned until the following business day.  For prescription refill requests, have your pharmacy contact our office and allow 72 hours.    Cancer Center Support Programs:   > Cancer Support Group  2nd Tuesday of the month 1pm-2pm, Journey Room

## 2021-09-22 ENCOUNTER — Other Ambulatory Visit (HOSPITAL_COMMUNITY): Payer: Self-pay | Admitting: Hematology

## 2021-09-22 DIAGNOSIS — Z1231 Encounter for screening mammogram for malignant neoplasm of breast: Secondary | ICD-10-CM

## 2021-09-24 ENCOUNTER — Other Ambulatory Visit (HOSPITAL_COMMUNITY): Payer: BC Managed Care – PPO

## 2021-09-24 ENCOUNTER — Ambulatory Visit (HOSPITAL_COMMUNITY): Payer: BC Managed Care – PPO

## 2021-09-29 ENCOUNTER — Other Ambulatory Visit (HOSPITAL_COMMUNITY): Payer: BC Managed Care – PPO

## 2021-09-29 ENCOUNTER — Encounter (HOSPITAL_COMMUNITY): Payer: BC Managed Care – PPO

## 2021-09-29 ENCOUNTER — Ambulatory Visit (HOSPITAL_COMMUNITY): Payer: BC Managed Care – PPO

## 2021-09-29 ENCOUNTER — Inpatient Hospital Stay (HOSPITAL_COMMUNITY): Payer: BC Managed Care – PPO

## 2021-10-05 ENCOUNTER — Ambulatory Visit (HOSPITAL_COMMUNITY): Payer: BC Managed Care – PPO

## 2021-10-05 ENCOUNTER — Other Ambulatory Visit (HOSPITAL_COMMUNITY): Payer: BC Managed Care – PPO

## 2021-10-12 ENCOUNTER — Ambulatory Visit (HOSPITAL_COMMUNITY): Payer: BC Managed Care – PPO | Admitting: Hematology

## 2021-10-21 ENCOUNTER — Ambulatory Visit (HOSPITAL_COMMUNITY)
Admission: RE | Admit: 2021-10-21 | Discharge: 2021-10-21 | Disposition: A | Discharge status: Home / Self Care | Payer: BC Managed Care – PPO | Source: Ambulatory Visit | Attending: Hematology | Admitting: Hematology

## 2021-10-21 ENCOUNTER — Inpatient Hospital Stay (HOSPITAL_COMMUNITY): Payer: BC Managed Care – PPO | Attending: Hematology

## 2021-10-21 DIAGNOSIS — E559 Vitamin D deficiency, unspecified: Secondary | ICD-10-CM

## 2021-10-21 DIAGNOSIS — G620 Drug-induced polyneuropathy: Secondary | ICD-10-CM | POA: Insufficient documentation

## 2021-10-21 DIAGNOSIS — Z1231 Encounter for screening mammogram for malignant neoplasm of breast: Secondary | ICD-10-CM | POA: Diagnosis present

## 2021-10-21 DIAGNOSIS — Z171 Estrogen receptor negative status [ER-]: Secondary | ICD-10-CM | POA: Insufficient documentation

## 2021-10-21 DIAGNOSIS — Z853 Personal history of malignant neoplasm of breast: Secondary | ICD-10-CM | POA: Diagnosis not present

## 2021-10-21 DIAGNOSIS — M8580 Other specified disorders of bone density and structure, unspecified site: Secondary | ICD-10-CM | POA: Insufficient documentation

## 2021-10-21 DIAGNOSIS — C50919 Malignant neoplasm of unspecified site of unspecified female breast: Secondary | ICD-10-CM

## 2021-10-21 LAB — CBC WITH DIFFERENTIAL/PLATELET
Abs Immature Granulocytes: 0 10*3/uL (ref 0.00–0.07)
Basophils Absolute: 0 10*3/uL (ref 0.0–0.1)
Basophils Relative: 1 %
Eosinophils Absolute: 0.2 10*3/uL (ref 0.0–0.5)
Eosinophils Relative: 3 %
HCT: 41.3 % (ref 36.0–46.0)
Hemoglobin: 13.6 g/dL (ref 12.0–15.0)
Immature Granulocytes: 0 %
Lymphocytes Relative: 38 %
Lymphs Abs: 2 10*3/uL (ref 0.7–4.0)
MCH: 31.6 pg (ref 26.0–34.0)
MCHC: 32.9 g/dL (ref 30.0–36.0)
MCV: 96 fL (ref 80.0–100.0)
Monocytes Absolute: 0.4 10*3/uL (ref 0.1–1.0)
Monocytes Relative: 8 %
Neutro Abs: 2.6 10*3/uL (ref 1.7–7.7)
Neutrophils Relative %: 50 %
Platelets: 268 10*3/uL (ref 150–400)
RBC: 4.3 MIL/uL (ref 3.87–5.11)
RDW: 12.9 % (ref 11.5–15.5)
WBC: 5.2 10*3/uL (ref 4.0–10.5)
nRBC: 0 % (ref 0.0–0.2)

## 2021-10-21 LAB — COMPREHENSIVE METABOLIC PANEL
ALT: 23 U/L (ref 0–44)
AST: 20 U/L (ref 15–41)
Albumin: 3.8 g/dL (ref 3.5–5.0)
Alkaline Phosphatase: 46 U/L (ref 38–126)
Anion gap: 9 (ref 5–15)
BUN: 22 mg/dL (ref 8–23)
CO2: 23 mmol/L (ref 22–32)
Calcium: 9.2 mg/dL (ref 8.9–10.3)
Chloride: 106 mmol/L (ref 98–111)
Creatinine, Ser: 0.75 mg/dL (ref 0.44–1.00)
GFR, Estimated: >60 mL/min (ref >60)
Glucose, Bld: 108 mg/dL — ABNORMAL HIGH (ref 70–99)
Potassium: 4.1 mmol/L (ref 3.5–5.1)
Sodium: 138 mmol/L (ref 135–145)
Total Bilirubin: 0.4 mg/dL (ref 0.3–1.2)
Total Protein: 7.2 g/dL (ref 6.5–8.1)

## 2021-10-21 LAB — VITAMIN D 25 HYDROXY (VIT D DEFICIENCY, FRACTURES): Vit D, 25-Hydroxy: 50.68 ng/mL (ref 30–100)

## 2021-11-03 ENCOUNTER — Inpatient Hospital Stay (HOSPITAL_COMMUNITY): Payer: BC Managed Care – PPO | Admitting: Hematology

## 2021-11-03 ENCOUNTER — Other Ambulatory Visit (HOSPITAL_COMMUNITY): Payer: Self-pay | Admitting: *Deleted

## 2021-11-03 VITALS — BP 145/92 | HR 77 | Temp 98.6°F | Resp 20 | Ht 62.6 in | Wt 241.8 lb

## 2021-11-03 DIAGNOSIS — T451X5A Adverse effect of antineoplastic and immunosuppressive drugs, initial encounter: Secondary | ICD-10-CM

## 2021-11-03 DIAGNOSIS — Z853 Personal history of malignant neoplasm of breast: Secondary | ICD-10-CM | POA: Diagnosis present

## 2021-11-03 DIAGNOSIS — C50919 Malignant neoplasm of unspecified site of unspecified female breast: Secondary | ICD-10-CM

## 2021-11-03 DIAGNOSIS — G620 Drug-induced polyneuropathy: Secondary | ICD-10-CM | POA: Diagnosis not present

## 2021-11-03 DIAGNOSIS — M8580 Other specified disorders of bone density and structure, unspecified site: Secondary | ICD-10-CM | POA: Diagnosis not present

## 2021-11-03 DIAGNOSIS — E559 Vitamin D deficiency, unspecified: Secondary | ICD-10-CM | POA: Diagnosis not present

## 2021-11-03 DIAGNOSIS — Z171 Estrogen receptor negative status [ER-]: Secondary | ICD-10-CM | POA: Insufficient documentation

## 2021-11-03 NOTE — Progress Notes (Signed)
? ?[facility] ?[address] ?[place], [place] [address] ? ? ?Patient Care Team: ?Betty Blitz, MD as PCP - General (Internal Medicine) ? 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 ?- SEE COMMENT. ?Microscopic Comment ?The carcinoma appears grade 3. Per outside report, the tumor cells are negative for estrogen receptor, progesterone ?receptor and Her 2 neu. Ki-67 is 86%. (JBK:ds 06/17/15) ?Betty Cutter MD ? ?  ?06/20/2015 Imaging  ? MRI breast Right breast: In the upper-outer quadrant posterior 1/3 depth right ?breast, there is a 2.1 x 1.3 x 1.4 cm spiculated enhancing mass with ?washout enhancement kinetics and associated biopsy clip consistent ?with patient's known cancer. ? ?  ?06/30/2015 - 08/19/2015 Chemotherapy  ? Dose Dense AC x 4 ? ? ?  ?09/02/2015 - 10/28/2015 Chemotherapy  ? Dose Dense Taxol X 2, reaction, then changed to abraxane ? ? ?  ?11/03/2015 Imaging  ? MRI breast Complete imaging response to neoadjuvant chemotherapy. No mass or ?abnormal enhancement is seen today. ? ?  ?12/12/2015 Surgery  ? RIGHT BREAST LUMPECTOMY WITH RADIOACTIVE SEED AND SENTINEL LYMPH NODE BIOPSY ?REMOVAL PORT-A-CATH, Dr. Excell Seltzer ? ?  ?12/12/2015 Pathology Results  ? Breast, lumpectomy, Right ?- FIBROSIS, HEMOSIDERIN DEPOSITION AND FOCAL GIANT CELL REACTION. ?- NO RESIDUAL TUMOR. ?- MARGINS NOT INVOLVED. ?2. Lymph node, sentinel, biopsy, Right axillary #1 ?- ONE BENIGN LYMPH NODE (0/1). ?3. Lymph node, sentinel, biopsy, Right axillary #2 ?- ONE BENIGN LYMPH NODE (0/1). ?4. Lymph node, sentinel, biopsy, Right axillary #3 ?- ONE BENIGN LYMPH NODE (0/1). ?5. Lymph node, sentinel, biopsy, Right axillary #4 ?- ONE BENIGN LYMPH NODE (0/1). ? ?  ?08/03/2016 Mammogram  ? IMPRESSION: ?Lumpectomy and radiation changes of the right breast. No evidence  of ?malignancy in either breast. ? ?  ?10/23/2018 Genetic Testing  ? Negative genetic testing on the common hereditary cancer panel.  The Hereditary Gene Panel offered by [facility] includes sequencing and/or deletion duplication testing of the following 48 genes: APC, ATM, AXIN2, BARD1, BMPR1A, BRCA1, BRCA2, BRIP1, CDH1, CDK4, CDKN2A (p14ARF), CDKN2A (p16INK4a), CHEK2, CTNNA1, DICER1, EPCAM (Deletion/duplication testing only), GREM1 (promoter region deletion/duplication testing only), KIT, MEN1, MLH1, MSH2, MSH3, MSH6, MUTYH, NBN, NF1, NHTL1, PALB2, PDGFRA, PMS2, POLD1, POLE, PTEN, RAD50, RAD51C, RAD51D, RNF43, SDHB, SDHC, SDHD, SMAD4, SMARCA4. STK11, TP53, TSC1, TSC2, and VHL.  The following genes were evaluated for sequence changes only: SDHA and HOXB13 c.251G>A variant only. The report date is October 23, 2018. ? ?  ? ? ?CHIEF COMPLIANT: Follow up for right breast cancer and chemo-induced neuropathy ? ? ?INTERVAL HISTORY: Betty Henry is a 66 y.o. female here today for follow up of her right breast cancer and chemo-induced neuropathy. Her last visit was on 04/06/2021.  ? ?Today she reports feeling good. She stopped Gabapentin due to vivid dreams.  ? ?REVIEW OF SYSTEMS:   ?Review of Systems  ?Constitutional:  Negative for appetite change and fatigue.  ?Respiratory:  Positive for shortness of breath (with exertion).   ?Musculoskeletal:  Positive for flank pain (R).  ?All other systems reviewed and are negative. ? ?I have reviewed the past medical history, past surgical history, social history and family history with the patient and they are unchanged from previous note. ? ? ?ALLERGIES:   ?is allergic to other, paclitaxel, camphor, adhesive [tape], amoxicillin, codeine, hydrocodone, and meperidine. ? ? ?MEDICATIONS:  ?  Current Outpatient Medications  ?Medication Sig Dispense Refill  ? acetaminophen (TYLENOL) 500 MG tablet Take 1,000 mg by mouth 2 (two) times daily as needed for moderate pain or headache.    ? albuterol  (VENTOLIN HFA) 108 (90 Base) MCG/ACT inhaler SMARTSIG:2 Puff(s) By Mouth Every 4 Hours PRN    ? ALPHA LIPOIC ACID-BIOTIN ER PO Take by mouth.    ? brimonidine (ALPHAGAN) 0.2 % ophthalmic solution 1 drop 2 (two) times daily.    ? clobetasol (OLUX) 0.05 % topical foam APPLY TO THE AFFECTED AREA(S) A SMALL AMOUNT TWICE DAILY AS NEEDED FOR TWO WEEKS ON, TWO WEEKS OFF. DO NOT APPLY TO FACE OR BETWEEN LEGS ARE    ? cyanocobalamin 2000 MCG tablet Take 2,000 mcg by mouth daily.    ? LAGEVRIO 200 MG CAPS capsule SMARTSIG:4 Capsule(s) By Mouth Every 12 Hours    ? levothyroxine (SYNTHROID, LEVOTHROID) 88 MCG tablet TAKE 1 TABLET BY MOUTH ON AN EMPTY STOMACH IN THE MORNING DAILY  4  ? omeprazole (PRILOSEC) 40 MG capsule Take 40 mg by mouth daily.     ? sulfamethoxazole-trimethoprim (BACTRIM DS) 800-160 MG tablet Take 1 tablet by mouth 2 (two) times daily.    ? Vitamin D, Ergocalciferol, (DRISDOL) 50000 units CAPS capsule Take 50,000 Units by mouth every Monday.   5  ? ?No current facility-administered medications for this visit.  ? ? ? ?PHYSICAL EXAMINATION: ?Performance status (ECOG): 1 - Symptomatic but completely ambulatory ? ?Vitals:  ? 11/03/21 1319  ?BP: (!) 145/92  ?Pulse: 77  ?Resp: 20  ?Temp: 98.6 ?F (37 ?C)  ?SpO2: 99%  ? ?Wt Readings from Last 3 Encounters:  ?11/03/21 241 lb 13.5 oz (109.7 kg)  ?04/06/21 236 lb 3.2 oz (107.1 kg)  ?09/29/20 231 lb 9.6 oz (105.1 kg)  ? ?Physical Exam ?Vitals reviewed.  ?Constitutional:   ?   Appearance: Normal appearance. She is obese.  ?Cardiovascular:  ?   Rate and Rhythm: Normal rate and regular rhythm.  ?   Pulses: Normal pulses.  ?   Heart sounds: Normal heart sounds.  ?Pulmonary:  ?   Effort: Pulmonary effort is normal.  ?   Breath sounds: Normal breath sounds.  ?Chest:  ?Breasts: ?   Right: Tenderness (@ lumpectomy site) present. No swelling, bleeding, inverted nipple, mass, nipple discharge or skin change (lumpectomy scar UOQ).  ?   Left: No swelling, bleeding, inverted nipple,  mass, nipple discharge, skin change or tenderness.  ?Abdominal:  ?   Palpations: Abdomen is soft. There is no hepatomegaly, splenomegaly or mass.  ?   Tenderness: There is no abdominal tenderness.  ?Lymphadenopathy:  ?   Upper Body:  ?   Right upper body: No supraclavicular, axillary or pectoral adenopathy.  ?   Left upper body: No supraclavicular, axillary or pectoral adenopathy.  ?Neurological:  ?   General: No focal deficit present.  ?   Mental Status: She is alert and oriented to person, place, and time.  ?Psychiatric:     ?   Mood and Affect: Mood normal.     ?   Behavior: Behavior normal.  ? ? ?Breast Exam Chaperone: Thana Ates   ? ? ?LABORATORY DATA:  ?I have reviewed the data as listed ? ?  Latest Ref Rng & Units 10/21/2021  ?  8:58 AM 04/01/2021  ?  9:54 AM 09/23/2020  ?  9:46 AM  ?CMP  ?Glucose 70 - 99 mg/dL 108   113   128    ?  BUN 8 - 23 mg/dL 22   23   17     ?Creatinine 0.44 - 1.00 mg/dL 0.75   0.89   0.83    ?Sodium 135 - 145 mmol/L 138   138   139    ?Potassium 3.5 - 5.1 mmol/L 4.1   4.2   4.0    ?Chloride 98 - 111 mmol/L 106   108   106    ?CO2 22 - 32 mmol/L 23   25   24     ?Calcium 8.9 - 10.3 mg/dL 9.2   9.1   9.4    ?Total Protein 6.5 - 8.1 g/dL 7.2   7.5   7.3    ?Total Bilirubin 0.3 - 1.2 mg/dL 0.4   0.7   0.7    ?Alkaline Phos 38 - 126 U/L 46   55   54    ?AST 15 - 41 U/L 20   21   23     ?ALT 0 - 44 U/L 23   26   26     ? ?No results found for: [id number] ?Lab Results  ?Component Value Date  ? WBC 5.2 10/21/2021  ? HGB 13.6 10/21/2021  ? HCT 41.3 10/21/2021  ? MCV 96.0 10/21/2021  ? PLT 268 10/21/2021  ? NEUTROABS 2.6 10/21/2021  ? ? ?ASSESSMENT:  ?1.  Stage IIa right breast TNBC: ?-Diagnosed on 05/21/2015, ER/PR/HER-2 negative, Ki-67 86%, grade 3 ?-Neoadjuvant chemotherapy with AC x4 followed by Taxol x2 cycles.  She developed reaction to Taxol and received Abraxane in place and completed on 10/28/2015. ?-Right lumpectomy on 12/12/2015 with complete pathological response, YPT0 YPN 0. ?-Germline mutation  testing was negative. ?-Mammogram reviewed on 09/18/2019 shows BI-RADS Category 2. ?  ?2.  Osteopenia: ?-Bone density test on 09/18/2019 shows T score of -1.6. ?  ?3.  Peripheral neuropathy: ?-Chemotherapy indu

## 2021-11-03 NOTE — Patient Instructions (Addendum)
[facility] at [facility] ?Discharge Instructions ? ? ?You were seen and examined today by Dr. Delton Coombes. ? ?He reviewed the results of your lab work which is normal/stable. ? ?Return as scheduled in 1 year.  ? ? ?Thank you for choosing [place] at [facility] to provide your oncology and hematology care.  To afford each patient quality time with our provider, please arrive at least 15 minutes before your scheduled appointment time.  ? ?If you have a lab appointment with the [facility] please come in thru the Main Entrance and check in at the main information desk. ? ?You need to re-schedule your appointment should you arrive 10 or more minutes late.  We strive to give you quality time with our providers, and arriving late affects you and other patients whose appointments are after yours.  Also, if you no show three or more times for appointments you may be dismissed from the clinic at the providers discretion.     ?Again, thank you for choosing [facility].  Our hope is that these requests will decrease the amount of time that you wait before being seen by our physicians.       ?_____________________________________________________________ ? ?Should you have questions after your visit to [facility] please contact our office at [phone] and follow the prompts.  Our office hours are 8:00 a.m. and 4:30 p.m. Monday - Friday.  Please note that voicemails left after 4:00 p.m. may not be returned until the following business day.  We are closed weekends and major holidays.  You do have access to a nurse 24-7, just call the main number to the clinic [phone] and do not press any options, hold on the line and a nurse will answer the phone.   ? ?For prescription refill requests, have your pharmacy contact our office and allow 72 hours.   ? ?Due to Covid, you will need to wear a mask upon entering the hospital. If you do not have a mask,  a mask will be given to you at the Main Entrance upon arrival. For doctor visits, patients may have 1 support person age 70 or older with them. For treatment visits, patients can not have anyone with them due to social distancing guidelines and our immunocompromised population.  ? ?   ?

## 2022-05-16 IMAGING — MG MM DIGITAL SCREENING BILAT W/ TOMO AND CAD
6 of 10 series · 6 of 30 positions shown · non-contrast
Comparison: Previous exam(s).

CLINICAL DATA: Screening.

EXAM:
DIGITAL SCREENING BILATERAL MAMMOGRAM WITH TOMOSYNTHESIS AND CAD
TECHNIQUE: Bilateral screening digital craniocaudal and mediolateral oblique
mammograms were obtained. Bilateral screening digital breast
tomosynthesis was performed. The images were evaluated with
computer-aided detection.

[L MLO synth-2D]
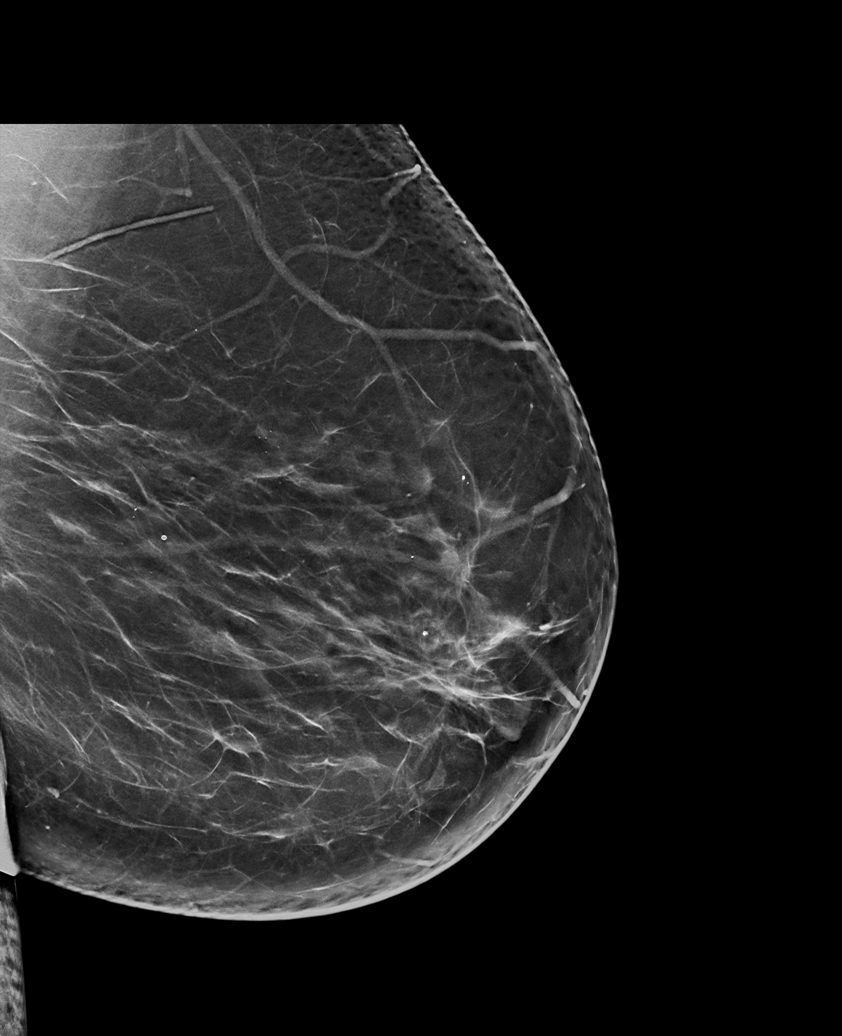

[L CC synth-2D (1 of 2)]
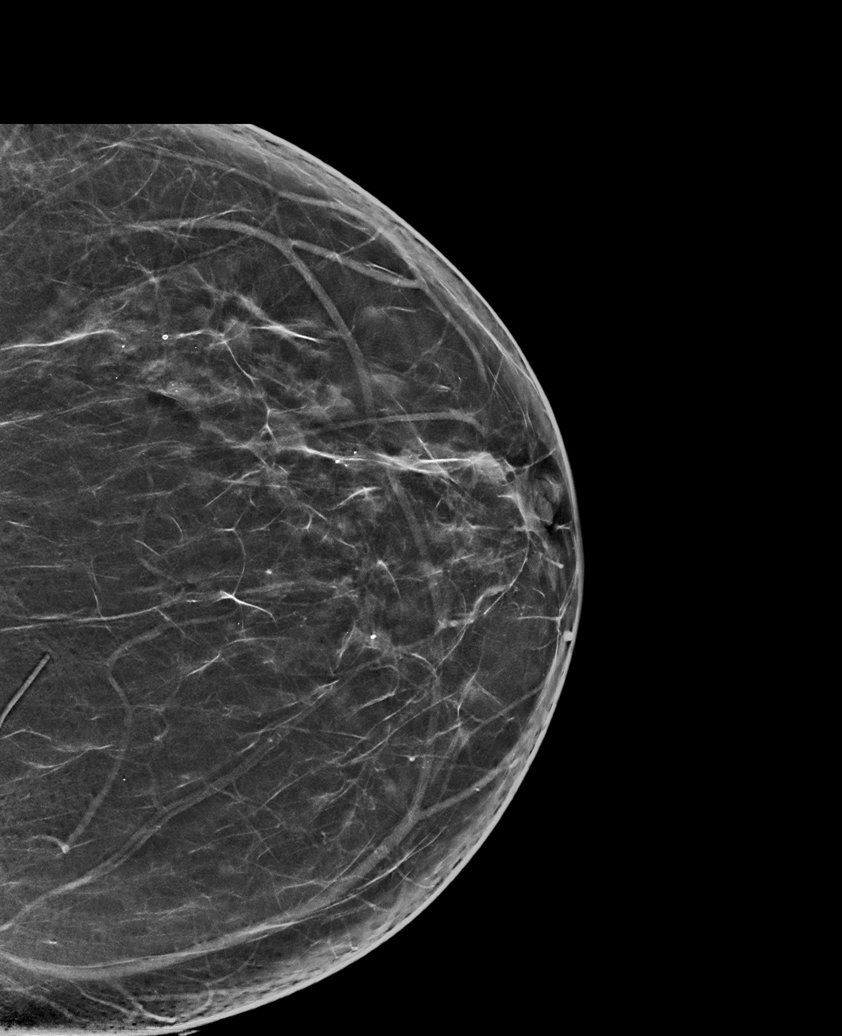

[R CC synth-2D]
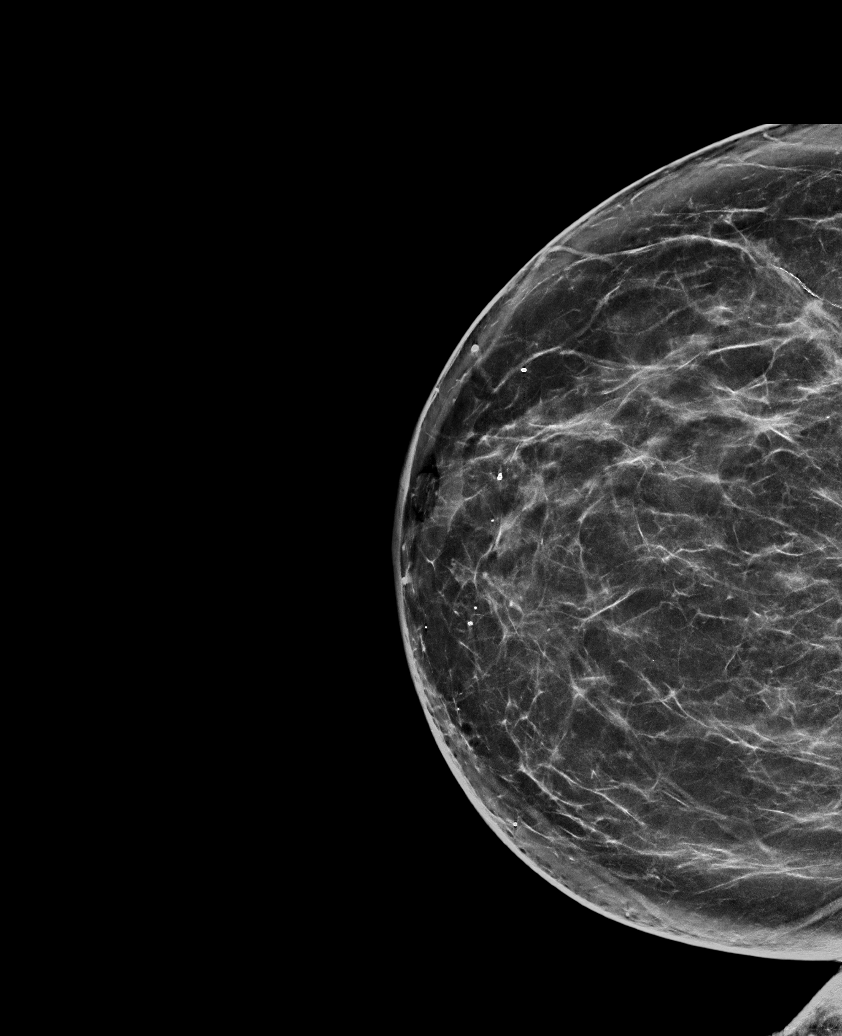

[L CC synth-2D (2 of 2)]
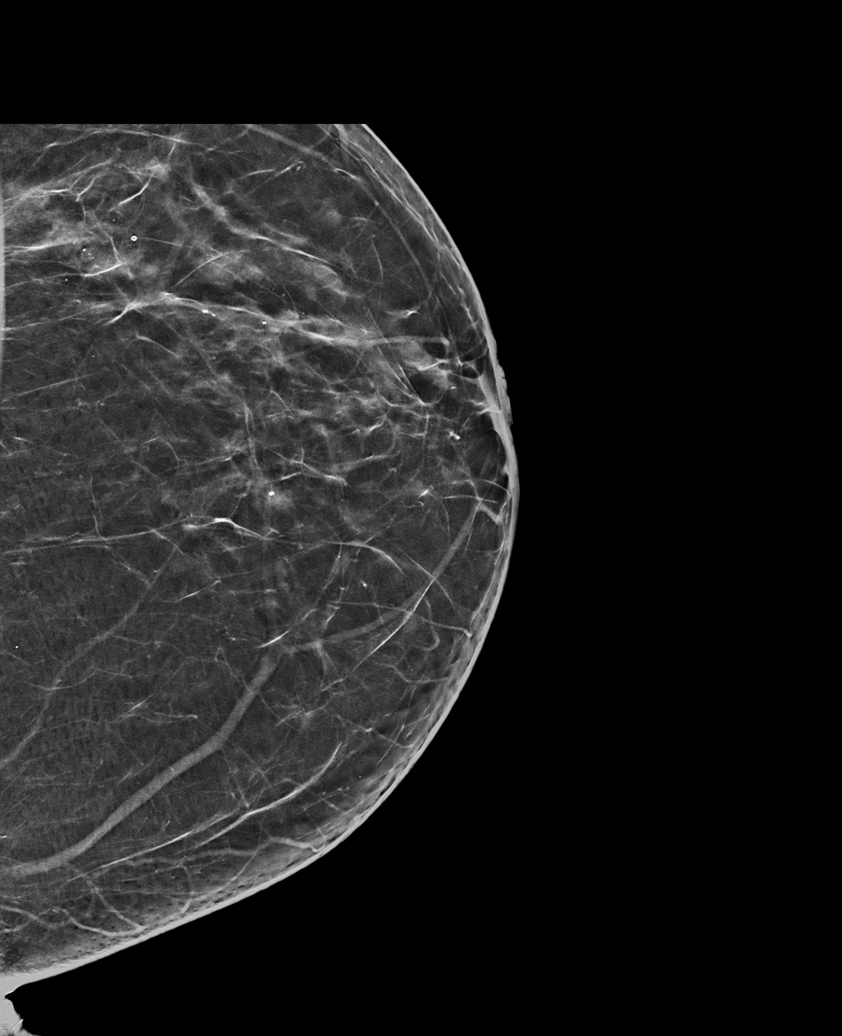

[R MLO synth-2D]
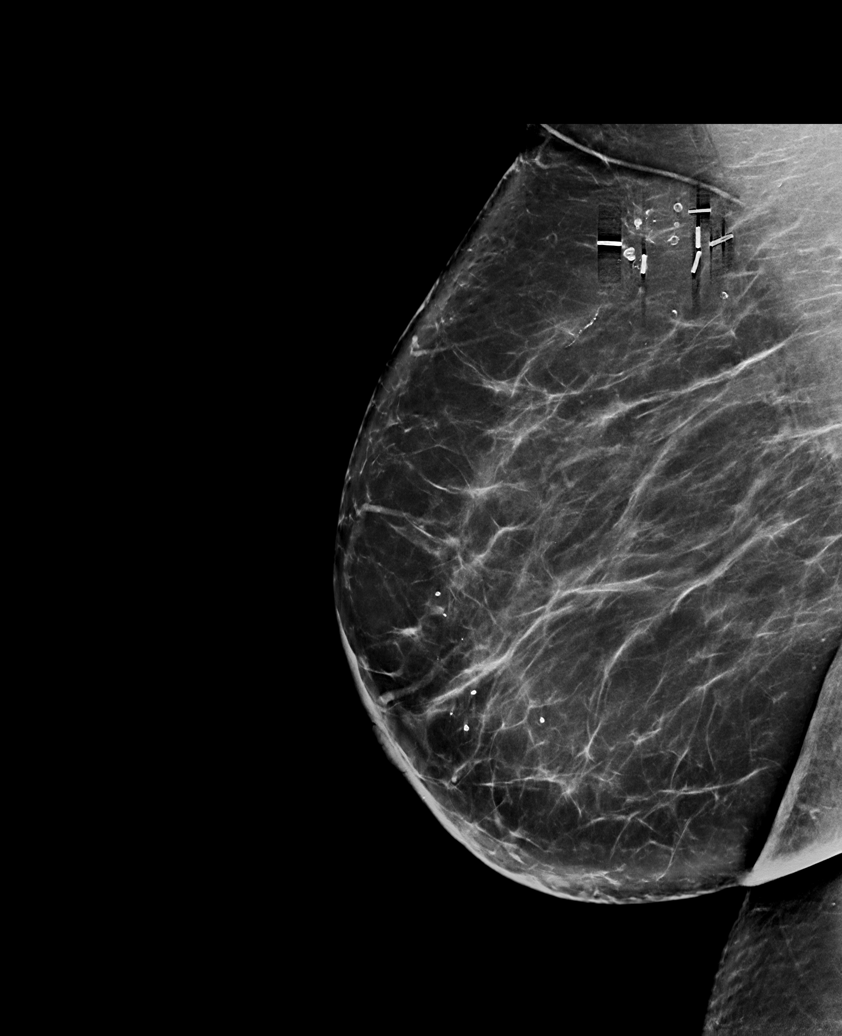

[L CC tomo · tomo slice 37/73.0]
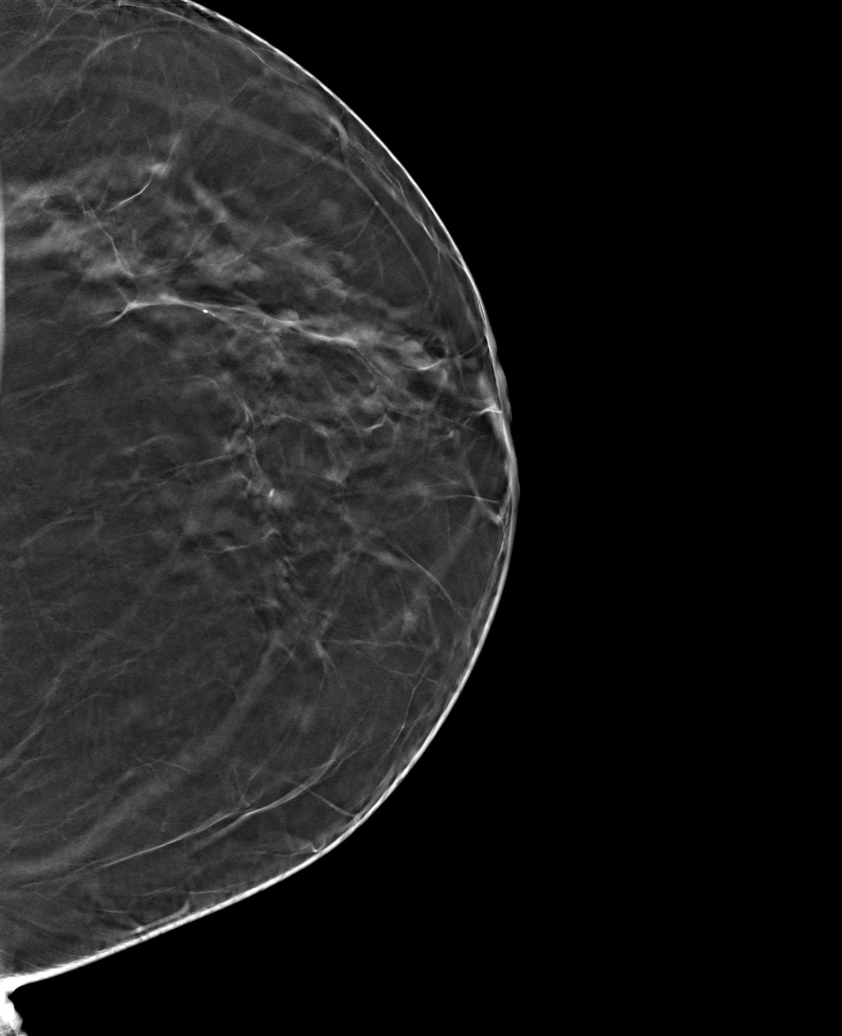

[6 of 30 positions shown; findings below may reference images not displayed]

ACR Breast Density Category b: There are scattered areas of
fibroglandular density.
FINDINGS: There are no findings suspicious for malignancy.
IMPRESSION: No mammographic evidence of malignancy. A result letter of this
screening mammogram will be mailed directly to the patient.

RECOMMENDATION:
Screening mammogram in one year. (Code:51-O-LD2)

BI-RADS CATEGORY  1: Negative.

## 2022-06-24 ENCOUNTER — Telehealth: Payer: Self-pay | Admitting: *Deleted

## 2022-06-24 NOTE — Telephone Encounter (Signed)
Patient called stating that she has noticed a new lump to right axilla.  Not particularly tender to the touch. Denies fever or other associated symptoms.  Will schedule for follow up to assess.

## 2022-06-28 ENCOUNTER — Ambulatory Visit: Payer: BC Managed Care – PPO | Admitting: Orthopedic Surgery

## 2022-06-28 ENCOUNTER — Encounter: Payer: Self-pay | Admitting: Orthopedic Surgery

## 2022-06-28 DIAGNOSIS — M7542 Impingement syndrome of left shoulder: Secondary | ICD-10-CM | POA: Diagnosis not present

## 2022-06-28 MED ORDER — METHYLPREDNISOLONE ACETATE 40 MG/ML IJ SUSP
40.0000 mg | INTRAMUSCULAR | Status: AC | PRN
  Administered 2022-06-28: 40 mg via INTRA_ARTICULAR

## 2022-06-28 MED ORDER — LIDOCAINE HCL 1 % IJ SOLN
5.0000 mL | INTRAMUSCULAR | Status: AC | PRN
  Administered 2022-06-28: 5 mL

## 2022-06-28 NOTE — Progress Notes (Signed)
Office Visit Note   Patient: Betty Henry           Date of Birth: [birth year]           MRN: [id number] Visit Date: 06/28/2022              Requested by: Monico Blitz, MD [address] [place],  [place] [address] PCP: Monico Blitz, MD  Chief Complaint  Patient presents with   Left Shoulder - Pain      HPI: Patient is a 66 year old woman who has been having increasing pain of her left shoulder.  She has more pain with abduction than flexion.  She states she has pain into the proximal arm.  She brings a CD that she had x-rays with her chiropractor.  Assessment & Plan: Visit Diagnoses:  1. Impingement syndrome of left shoulder     Plan: Patient underwent a subacromial injection reevaluate in 4 weeks.  Patient may require an MRI scan.  Follow-Up Instructions: Return in about 4 weeks (around 07/26/2022).   Ortho Exam  Patient is alert, oriented, no adenopathy, well-dressed, normal affect, normal respiratory effort. Examination patient has glenohumeral abduction of only 70 degrees.  Active flexion to 90 degrees.  She has pain with Neer and Hawkins impingement test she has pain to palpation over the biceps.  [facility] joint is not tender to palpation.  Imaging: No results found. No images are attached to the encounter.  Labs: No results found for: "HGBA1C", "ESRSEDRATE", "CRP", "LABURIC", "REPTSTATUS", "GRAMSTAIN", "CULT", "LABORGA"   Lab Results  Component Value Date   ALBUMIN 3.8 10/21/2021   ALBUMIN 4.2 04/01/2021   ALBUMIN 4.0 09/23/2020    No results found for: "MG" Lab Results  Component Value Date   VD25OH 50.68 10/21/2021   VD25OH 76.55 09/23/2020   VD25OH 73.61 04/07/2020    No results found for: "PREALBUMIN"    Latest Ref Rng & Units 10/21/2021    8:58 AM 04/01/2021    9:54 AM 09/23/2020    9:46 AM  CBC EXTENDED  WBC 4.0 - 10.5 K/uL 5.2  6.4  5.1   RBC 3.87 - 5.11 MIL/uL 4.30  4.35  4.25   Hemoglobin 12.0 - 15.0 g/dL 13.6  14.0  13.6   HCT 36.0 - 46.0 % 41.3  42.8   41.2   Platelets 150 - 400 K/uL 268  296  251   NEUT# 1.7 - 7.7 K/uL 2.6  3.9  2.8   Lymph# 0.7 - 4.0 K/uL 2.0  1.7  1.6      There is no height or weight on file to calculate BMI.  Orders:  No orders of the defined types were placed in this encounter.  No orders of the defined types were placed in this encounter.    Procedures: Large Joint Inj: L subacromial bursa on 06/28/2022 3:24 PM Indications: diagnostic evaluation and pain Details: 22 G 1.5 in needle, posterior approach  Arthrogram: No  Medications: 5 mL lidocaine 1 %; 40 mg methylPREDNISolone acetate 40 MG/ML Outcome: tolerated well, no immediate complications Procedure, treatment alternatives, risks and benefits explained, specific risks discussed. Consent was given by the patient. Immediately prior to procedure a time out was called to verify the correct patient, procedure, equipment, support staff and site/side marked as required. Patient was prepped and draped in the usual sterile fashion.      Clinical Data: No additional findings.  ROS:  All other systems negative, except as noted in the HPI. Review of Systems  Objective: Vital Signs: There were no vitals taken for this visit.  Specialty Comments:  No specialty comments available.  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 49   Emphysema Mother    Thyroid disease Mother    Diabetes Father    Hypertension Father    Stroke Father        d. 14   Kidney disease Sister        Stage III   Thyroid disease Sister    Cervical cancer Sister    Cervical cancer Maternal Grandmother    Heart attack Maternal Grandfather    Stomach cancer Paternal Grandfather    Bladder Cancer Paternal Aunt    Adrenal disorder Neg Hx     Past Surgical History:  Procedure Laterality Date   ABDOMINAL HYSTERECTOMY     partial   BREAST LUMPECTOMY WITH RADIOACTIVE SEED AND SENTINEL LYMPH NODE BIOPSY Right 12/12/2015   Procedure: RIGHT BREAST LUMPECTOMY WITH RADIOACTIVE SEED AND SENTINEL LYMPH NODE BIOPSY;  Surgeon: Excell Seltzer, MD;  Location: [facility];  Service: General;  Laterality: Right;   BREAST SURGERY     BUNIONECTOMY Left    CHOLECYSTECTOMY  09/2010   CHOLECYSTECTOMY     CYST EXCISION Left    wrist   DILATION AND CURETTAGE OF UTERUS     FOOT SURGERY  2004   left   HAND TENDON SURGERY Left 2012   KNEE ARTHROSCOPY Left    x2   KNEE SURGERY  2000, 2008   left   PARTIAL HYSTERECTOMY  1989   PORT-A-CATH REMOVAL Left 12/12/2015   Procedure: REMOVAL PORT-A-CATH;  Surgeon: Excell Seltzer, MD;  Location: [facility];  Service: General;  Laterality: Left;   PORTACATH PLACEMENT Left 06/27/2015   Procedure: INSERTION PORT-A-CATH;  Surgeon: Excell Seltzer, MD;  Location: [facility];  Service: General;  Laterality: Left;   PTOSIS REPAIR Bilateral 10/21/2016   Procedure: INTERNAL PTOSIS REPAIR;  Surgeon: Clista Bernhardt, MD;  Location: [facility];  Service: Plastics;  Laterality: Bilateral;   WRIST ARTHROSCOPY  06/29/2011   Procedure: ARTHROSCOPY WRIST;  Surgeon: Tennis Must;  Location:  [facility];  Service: Orthopedics;  Laterality: Left;  left wrist tfcc repair   Social History   Occupational History   Not on file  Tobacco Use   Smoking status: Never   Smokeless tobacco: Never  Vaping Use  Vaping Use: Never used  Substance and Sexual Activity   Alcohol use: No   Drug use: No   Sexual activity: Yes    Comment: married

## 2022-06-29 ENCOUNTER — Inpatient Hospital Stay: Payer: BC Managed Care – PPO | Attending: Hematology | Admitting: Hematology

## 2022-06-29 VITALS — BP 145/87 | HR 73 | Temp 97.4°F | Resp 17 | Ht 62.0 in | Wt 239.2 lb

## 2022-06-29 DIAGNOSIS — M8580 Other specified disorders of bone density and structure, unspecified site: Secondary | ICD-10-CM | POA: Diagnosis not present

## 2022-06-29 DIAGNOSIS — Z853 Personal history of malignant neoplasm of breast: Secondary | ICD-10-CM | POA: Diagnosis not present

## 2022-06-29 DIAGNOSIS — G620 Drug-induced polyneuropathy: Secondary | ICD-10-CM | POA: Insufficient documentation

## 2022-06-29 DIAGNOSIS — C50919 Malignant neoplasm of unspecified site of unspecified female breast: Secondary | ICD-10-CM

## 2022-06-29 DIAGNOSIS — T451X5D Adverse effect of antineoplastic and immunosuppressive drugs, subsequent encounter: Secondary | ICD-10-CM | POA: Insufficient documentation

## 2022-06-29 DIAGNOSIS — Z79899 Other long term (current) drug therapy: Secondary | ICD-10-CM | POA: Insufficient documentation

## 2022-06-29 NOTE — Progress Notes (Signed)
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 - SEE COMMENT. Microscopic Comment The carcinoma appears grade 3. Per outside report, the tumor cells are negative for estrogen receptor, progesterone receptor and Her 2 neu. Ki-67 is 86%. (JBK:ds 06/17/15) Betty KISH MD   06/20/2015 Imaging   MRI breast Right breast: In the upper-outer quadrant posterior 1/3 depth right breast, there is a 2.1 x 1.3 x 1.4 cm spiculated enhancing mass with washout enhancement kinetics and associated biopsy clip consistent with patient's known cancer.   06/30/2015 - 08/19/2015 Chemotherapy   Dose Dense AC x 4    09/02/2015 - 10/28/2015 Chemotherapy   Dose Dense Taxol X 2, reaction, then changed to abraxane    11/03/2015 Imaging   MRI breast Complete imaging response to neoadjuvant chemotherapy. No mass or abnormal enhancement is seen today.   12/12/2015 Surgery   RIGHT BREAST LUMPECTOMY WITH RADIOACTIVE SEED AND SENTINEL LYMPH NODE BIOPSY REMOVAL PORT-A-CATH, Dr. Excell Seltzer   12/12/2015 Pathology Results   Breast, lumpectomy, Right - FIBROSIS, HEMOSIDERIN DEPOSITION AND FOCAL GIANT CELL REACTION. - NO RESIDUAL TUMOR. - MARGINS NOT INVOLVED. 2. Lymph node, sentinel, biopsy, Right axillary #1 - ONE BENIGN LYMPH NODE (0/1). 3. Lymph node, sentinel, biopsy, Right axillary #2 - ONE BENIGN LYMPH NODE (0/1). 4. Lymph node, sentinel, biopsy, Right axillary #3 - ONE BENIGN LYMPH NODE (0/1). 5. Lymph node, sentinel, biopsy, Right axillary #4 - ONE BENIGN LYMPH NODE (0/1).   08/03/2016 Mammogram   IMPRESSION: Lumpectomy and radiation changes of the right breast. No evidence of malignancy in  either breast.   10/23/2018 Genetic Testing   Negative genetic testing on the common hereditary cancer panel.  The Hereditary Gene Panel offered by [facility] includes sequencing and/or deletion duplication testing of the following 48 genes: APC, ATM, AXIN2, BARD1, BMPR1A, BRCA1, BRCA2, BRIP1, CDH1, CDK4, CDKN2A (p14ARF), CDKN2A (p16INK4a), CHEK2, CTNNA1, DICER1, EPCAM (Deletion/duplication testing only), GREM1 (promoter region deletion/duplication testing only), KIT, MEN1, MLH1, MSH2, MSH3, MSH6, MUTYH, NBN, NF1, NHTL1, PALB2, PDGFRA, PMS2, POLD1, POLE, PTEN, RAD50, RAD51C, RAD51D, RNF43, SDHB, SDHC, SDHD, SMAD4, SMARCA4. STK11, TP53, TSC1, TSC2, and VHL.  The following genes were evaluated for sequence changes only: SDHA and HOXB13 c.251G>A variant only. The report date is October 23, 2018.     CHIEF COMPLIANT: Follow up for right breast cancer and chemo-induced neuropathy   INTERVAL HISTORY: [name] is a 66 y.o. female seen for follow-up of right breast cancer.  She reportedly felt some not in the right axillary region and right upper breast for the last couple of months.  Reports energy levels of 40%.  REVIEW OF SYSTEMS:   Review of Systems  Constitutional:  Negative for appetite change and fatigue.  Respiratory:  Positive for shortness of breath (with exertion).   Gastrointestinal:  Positive for nausea (Occasionally).  All other systems reviewed and are negative.   I have reviewed the past medical history, past surgical history, social history and family history with the patient and they are unchanged from previous note.   ALLERGIES:   is allergic to other, paclitaxel, camphor, adhesive [tape], amoxicillin, codeine, hydrocodone, and meperidine.   MEDICATIONS:  Current Outpatient Medications  Medication Sig Dispense Refill  acetaminophen (TYLENOL) 500 MG tablet Take 1,000 mg by mouth 2 (two) times daily as needed for moderate pain or headache.     albuterol (VENTOLIN HFA) 108 (90  Base) MCG/ACT inhaler SMARTSIG:2 Puff(s) By Mouth Every 4 Hours PRN     ALPHA LIPOIC ACID-BIOTIN ER PO Take by mouth.     brimonidine (ALPHAGAN) 0.2 % ophthalmic solution 1 drop 2 (two) times daily.     clobetasol (OLUX) 0.05 % topical foam APPLY TO THE AFFECTED AREA(S) A SMALL AMOUNT TWICE DAILY AS NEEDED FOR TWO WEEKS ON, TWO WEEKS OFF. DO NOT APPLY TO FACE OR BETWEEN LEGS ARE     cyanocobalamin 2000 MCG tablet Take 2,000 mcg by mouth daily.     LAGEVRIO 200 MG CAPS capsule SMARTSIG:4 Capsule(s) By Mouth Every 12 Hours     levothyroxine (SYNTHROID, LEVOTHROID) 88 MCG tablet TAKE 1 TABLET BY MOUTH ON AN EMPTY STOMACH IN THE MORNING DAILY  4   omeprazole (PRILOSEC) 40 MG capsule Take 40 mg by mouth daily.      sulfamethoxazole-trimethoprim (BACTRIM DS) 800-160 MG tablet Take 1 tablet by mouth 2 (two) times daily.     Vitamin D, Ergocalciferol, (DRISDOL) 50000 units CAPS capsule Take 50,000 Units by mouth every Monday.   5   No current facility-administered medications for this visit.     PHYSICAL EXAMINATION: Performance status (ECOG): 1 - Symptomatic but completely ambulatory  Vitals:   06/29/22 1510  BP: (!) 145/87  Pulse: 73  Resp: 17  Temp: (!) 97.4 F (36.3 C)  SpO2: 99%   Wt Readings from Last 3 Encounters:  06/29/22 239 lb 3.2 oz (108.5 kg)  11/03/21 241 lb 13.5 oz (109.7 kg)  04/06/21 236 lb 3.2 oz (107.1 kg)   Physical Exam Vitals reviewed.  Constitutional:      Appearance: Normal appearance. She is obese.  Cardiovascular:     Rate and Rhythm: Normal rate and regular rhythm.     Pulses: Normal pulses.     Heart sounds: Normal heart sounds.  Pulmonary:     Effort: Pulmonary effort is normal.     Breath sounds: Normal breath sounds.  Chest:  Breasts:    Right: Tenderness (@ lumpectomy site) present. No swelling, bleeding, inverted nipple, mass, nipple discharge or skin change (lumpectomy scar UOQ).     Left: No swelling, bleeding, inverted nipple, mass, nipple  discharge, skin change or tenderness.  Abdominal:     Palpations: Abdomen is soft. There is no hepatomegaly, splenomegaly or mass.     Tenderness: There is no abdominal tenderness.  Lymphadenopathy:     Upper Body:     Right upper body: No supraclavicular, axillary or pectoral adenopathy.     Left upper body: No supraclavicular, axillary or pectoral adenopathy.  Neurological:     General: No focal deficit present.     Mental Status: She is alert and oriented to person, place, and time.  Psychiatric:        Mood and Affect: Mood normal.        Behavior: Behavior normal.     Breast Exam Chaperone: Thana Ates     LABORATORY DATA:  I have reviewed the data as listed    Latest Ref Rng & Units 10/21/2021    8:58 AM 04/01/2021    9:54 AM 09/23/2020    9:46 AM  CMP  Glucose 70 - 99 mg/dL 108  113  128   BUN 8 - 23 mg/dL 22  23  17  Creatinine 0.44 - 1.00 mg/dL 0.75  0.89  0.83   Sodium 135 - 145 mmol/L 138  138  139   Potassium 3.5 - 5.1 mmol/L 4.1  4.2  4.0   Chloride 98 - 111 mmol/L 106  108  106   CO2 22 - 32 mmol/L _0 Calcium 8.9 - 10.3 mg/dL 9.2  9.1  9.4   Total Protein 6.5 - 8.1 g/dL 7.2  7.5  7.3   Total Bilirubin 0.3 - 1.2 mg/dL 0.4  0.7  0.7   Alkaline Phos 38 - 126 U/L 46  55  54   AST 15 - 41 U/L _1 ALT 0 - 44 U/L _2 No results found for: "CAN153" Lab Results  Component Value Date   WBC 5.2 10/21/2021   HGB 13.6 10/21/2021   HCT 41.3 10/21/2021   MCV 96.0 10/21/2021   PLT 268 10/21/2021   NEUTROABS 2.6 10/21/2021    ASSESSMENT:  1.  Stage IIa right breast TNBC: -Diagnosed on 05/21/2015, ER/PR/HER-2 negative, Ki-67 86%, grade 3 -Neoadjuvant chemotherapy with AC x4 followed by Taxol x2 cycles.  She developed reaction to Taxol and received Abraxane in place and completed on 10/28/2015. -Right lumpectomy on 12/12/2015 with complete pathological response, YPT0 YPN 0. -Germline mutation testing was negative. -Mammogram reviewed on  09/18/2019 shows BI-RADS Category 2.   2.  Osteopenia: -Bone density test on 09/18/2019 shows T score of -1.6.   3.  Peripheral neuropathy: -Chemotherapy induced numbness in the hands.  Gabapentin cause drowsiness.   PLAN:  1.  Stage IIa right breast TNBC: - She came to [place] today as she was complaining of a lump in the right axilla and possible mass in the right upper breast for the last couple of months. - Physical examination today shows thickening in the area at 12:00 in the right breast.  Right lumpectomy scar upper outer quadrant in the axillary region, there is no mass palpable. - Due to the thickening/vague masslike area at the 12 o'clock position, I recommended diagnostic mammogram of the right breast with ultrasound. - We will do telephone follow-up after the studies.   2.  Osteopenia: - Last vitamin D level was 50.  Continue vitamin D 50,000 units weekly.   3.  Peripheral neuropathy: - Cymbalta was discontinued secondary to vivid dreams.  She is taking alpha lipoic acid which is helping.   Breast Cancer therapy associated bone loss: I have recommended calcium, Vitamin D and weight bearing exercises.  Orders placed this encounter:  Orders Placed This Encounter  Procedures   US Breast Limited Uni Right Inc Axilla   MM DIAG BREAST TOMO UNI RIGHT     The patient has a good understanding of the overall plan. She agrees with it. She will call with any problems that may develop before the next visit here.  Derek Jack, MD [place] [phone]

## 2022-06-29 NOTE — Patient Instructions (Signed)
[facility]  Discharge Instructions  You were seen and examined today by Dr. Delton Coombes.  He ordered an ultrasound and mammogram on the right breast due to you having pain.  Follow-up as scheduled by phone visit with results of mammogram and ultrasound.    Thank you for choosing [facility] to provide your oncology and hematology care.   To afford each patient quality time with our provider, please arrive at least 15 minutes before your scheduled appointment time. You may need to reschedule your appointment if you arrive late (10 or more minutes). Arriving late affects you and other patients whose appointments are after yours.  Also, if you miss three or more appointments without notifying the office, you may be dismissed from the clinic at the provider's discretion.    Again, thank you for choosing [facility].  Our hope is that these requests will decrease the amount of time that you wait before being seen by our physicians.   If you have a lab appointment with the [facility] please come in thru the Main Entrance and check in at the main information desk.           _____________________________________________________________  Should you have questions after your visit to [facility], please contact our office at [phone] and follow the prompts.  Our office hours are 8:00 a.m. to 4:30 p.m. Monday - Thursday and 8:00 a.m. to 2:30 p.m. Friday.  Please note that voicemails left after 4:00 p.m. may not be returned until the following business day.  We are closed weekends and all major holidays.  You do have access to a nurse 24-7, just call the main number to the clinic [phone] and do not press any options, hold on the line and a nurse will answer the phone.    For prescription refill requests, have your pharmacy contact our office and allow 72 hours.    Masks are optional in the cancer centers. If  you would like for your care team to wear a mask while they are taking care of you, please let them know. You may have one support person who is at least 67 years old accompany you for your appointments.

## 2022-07-22 ENCOUNTER — Ambulatory Visit (HOSPITAL_COMMUNITY)
Admission: RE | Admit: 2022-07-22 | Discharge: 2022-07-22 | Disposition: A | Discharge status: Home / Self Care | Payer: BC Managed Care – PPO | Source: Ambulatory Visit | Attending: Hematology | Admitting: Hematology

## 2022-07-22 DIAGNOSIS — C50919 Malignant neoplasm of unspecified site of unspecified female breast: Secondary | ICD-10-CM | POA: Insufficient documentation

## 2022-07-22 DIAGNOSIS — Z17421 Hormone receptor negative with human epidermal growth factor receptor 2 negative status: Secondary | ICD-10-CM

## 2022-07-26 ENCOUNTER — Ambulatory Visit: Payer: BC Managed Care – PPO | Admitting: Orthopedic Surgery

## 2022-07-26 ENCOUNTER — Encounter: Payer: Self-pay | Admitting: Orthopedic Surgery

## 2022-07-26 DIAGNOSIS — M7542 Impingement syndrome of left shoulder: Secondary | ICD-10-CM | POA: Diagnosis not present

## 2022-07-26 NOTE — Progress Notes (Signed)
Office Visit Note   Patient: Betty Henry           Date of Birth: 1956-01-10           MRN: [id number] Visit Date: 07/26/2022              Requested by: Monico Blitz, MD [place],  [place] [address] PCP: Monico Blitz, MD  No chief complaint on file.     HPI: Patient is a 67 year old woman who presents in follow-up approximately 4 weeks status post left shoulder subacromial injection.  Patient states she has had much improved range of motion and decreased symptoms and was able to use a roller to pain.  Assessment & Plan: Visit Diagnoses:  1. Impingement syndrome of left shoulder     Plan: Patient was given a handwritten prescription to follow-up with therapy in [place].  She will work on scapular stabilization and strengthening.  Follow-Up Instructions: Return in about 2 months (around 09/24/2022).   Ortho Exam  Patient is alert, oriented, no adenopathy, well-dressed, normal affect, normal respiratory effort. Examination patient has improved active and passive range of motion of the left shoulder active abduction to 90 degrees and flexion to 120 degrees.  Imaging: No results found. No images are attached to the encounter.  Labs: No results found for: "HGBA1C", "ESRSEDRATE", "CRP", "LABURIC", "REPTSTATUS", "GRAMSTAIN", "CULT", "LABORGA"   Lab Results  Component Value Date   ALBUMIN 3.8 10/21/2021   ALBUMIN 4.2 04/01/2021   ALBUMIN 4.0 09/23/2020    No results found for: "MG" Lab Results  Component Value Date   VD25OH 50.68 10/21/2021   VD25OH 76.55 09/23/2020   VD25OH 73.61 04/07/2020    No results found for: "PREALBUMIN"    Latest Ref Rng & Units 10/21/2021    8:58 AM 04/01/2021    9:54 AM 09/23/2020    9:46 AM  CBC EXTENDED  WBC 4.0 - 10.5 K/uL 5.2  6.4  5.1   RBC 3.87 - 5.11 MIL/uL 4.30  4.35  4.25   Hemoglobin 12.0 - 15.0 g/dL 13.6  14.0  13.6   HCT 36.0 - 46.0 % 41.3  42.8  41.2   Platelets 150 - 400 K/uL 268  296  251   NEUT# 1.7 - 7.7 K/uL 2.6   3.9  2.8   Lymph# 0.7 - 4.0 K/uL 2.0  1.7  1.6      There is no height or weight on file to calculate BMI.  Orders:  No orders of the defined types were placed in this encounter.  No orders of the defined types were placed in this encounter.    Procedures: No procedures performed  Clinical Data: No additional findings.  ROS:  All other systems negative, except as noted in the HPI. Review of Systems  Objective: Vital Signs: There were no vitals taken for this visit.  Specialty Comments:  No specialty comments available.  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 ([facility]) 04/18/2016   Past Medical History:  Diagnosis Date   Achilles tendon pain    Anemia    Arthritis    knees   Breast cancer ([facility])    Cancer ([facility])    right breast-already finished chemo   Cataracts, bilateral    Family  history of bladder cancer    Family history of stomach cancer    GERD (gastroesophageal reflux disease)    Glaucoma    Hypothyroidism    Neuropathy    FROM CHEMO      Psoriasis    elbows, knees   Psoriasis    bil legs, elbows and hands   TFCC (triangular fibrocartilage complex) tear    left    Family History  Problem Relation Age of Onset   COPD Mother        d. 69   Emphysema Mother    Thyroid disease Mother    Diabetes Father    Hypertension Father    Stroke Father        d. 52   Kidney disease Sister        Stage III   Thyroid disease Sister    Cervical cancer Sister    Cervical cancer Maternal Grandmother    Heart attack Maternal Grandfather    Stomach cancer Paternal Grandfather    Bladder Cancer Paternal Aunt    Adrenal disorder Neg Hx     Past Surgical History:  Procedure Laterality Date   ABDOMINAL HYSTERECTOMY     partial    BREAST LUMPECTOMY WITH RADIOACTIVE SEED AND SENTINEL LYMPH NODE BIOPSY Right 12/12/2015   Procedure: RIGHT BREAST LUMPECTOMY WITH RADIOACTIVE SEED AND SENTINEL LYMPH NODE BIOPSY;  Surgeon: Excell Seltzer, MD;  Location: [facility];  Service: General;  Laterality: Right;   BREAST SURGERY     BUNIONECTOMY Left    CHOLECYSTECTOMY  09/2010   CHOLECYSTECTOMY     CYST EXCISION Left    wrist   DILATION AND CURETTAGE OF UTERUS     FOOT SURGERY  2004   left   HAND TENDON SURGERY Left 2012   KNEE ARTHROSCOPY Left    x2   KNEE SURGERY  2000, 2008   left   PARTIAL HYSTERECTOMY  1989   PORT-A-CATH REMOVAL Left 12/12/2015   Procedure: REMOVAL PORT-A-CATH;  Surgeon: Excell Seltzer, MD;  Location: [facility];  Service: General;  Laterality: Left;   PORTACATH PLACEMENT Left 06/27/2015   Procedure: INSERTION PORT-A-CATH;  Surgeon: Excell Seltzer, MD;  Location: [facility];  Service: General;  Laterality: Left;   PTOSIS REPAIR Bilateral 10/21/2016   Procedure: INTERNAL PTOSIS REPAIR;  Surgeon: Clista Bernhardt, MD;  Location: [facility];  Service: Plastics;  Laterality: Bilateral;   WRIST ARTHROSCOPY  06/29/2011   Procedure: ARTHROSCOPY WRIST;  Surgeon: Tennis Must;  Location: [facility];  Service: Orthopedics;  Laterality: Left;  left wrist tfcc repair   Social History   Occupational History   Not on file  Tobacco Use   Smoking status: Never   Smokeless tobacco: Never  Vaping Use   Vaping Use: Never used  Substance and Sexual Activity   Alcohol use: No   Drug use: No   Sexual activity: Yes    Comment: married

## 2022-07-27 ENCOUNTER — Encounter: Payer: Self-pay | Admitting: Hematology

## 2022-07-27 ENCOUNTER — Other Ambulatory Visit: Payer: Self-pay

## 2022-07-27 ENCOUNTER — Inpatient Hospital Stay: Payer: BC Managed Care – PPO | Attending: Hematology | Admitting: Hematology

## 2022-07-27 DIAGNOSIS — Z171 Estrogen receptor negative status [ER-]: Secondary | ICD-10-CM | POA: Diagnosis not present

## 2022-07-27 DIAGNOSIS — C50911 Malignant neoplasm of unspecified site of right female breast: Secondary | ICD-10-CM

## 2022-07-27 DIAGNOSIS — Z9221 Personal history of antineoplastic chemotherapy: Secondary | ICD-10-CM | POA: Diagnosis not present

## 2022-07-27 DIAGNOSIS — Z1231 Encounter for screening mammogram for malignant neoplasm of breast: Secondary | ICD-10-CM

## 2022-07-27 DIAGNOSIS — C50919 Malignant neoplasm of unspecified site of unspecified female breast: Secondary | ICD-10-CM

## 2022-07-27 NOTE — Progress Notes (Signed)
Message received from Dr. Delton Coombes- Please order bilateral screening mammogram after 10/22/2022. Thanks.   Order placed per Dr. Tomie China order.

## 2022-07-27 NOTE — Progress Notes (Signed)
Virtual Visit via Telephone Note  I connected with Betty Henry on 07/27/22 at  3:45 PM EST by telephone and verified that I am speaking with the correct person using two identifiers.  Location: Patient: At home Provider: In the office   I discussed the limitations, risks, security and privacy concerns of performing an evaluation and management service by telephone and the availability of in person appointments. I also discussed with the patient that there may be a patient responsible charge related to this service. The patient expressed understanding and agreed to proceed.   History of Present Illness: She is seen in our clinic for follow-up of stage II right breast triple negative breast cancer.  She had neoadjuvant chemotherapy followed by a right lumpectomy on 12/12/2015 which showed complete pathological response.  Germline mutation testing was negative.   Observations/Objective: She continues to have fullness in the right axilla and right breast region which she had since October last year.  Assessment and Plan:  1.  Stage IIa right breast TNBC: - At last visit physical examination showed thickening in the area of 12 o'clock position in the right breast.  Right lumpectomy scar in the upper outer quadrant and axillary region, there is no mass palpable.  Due to thickening/vague masslike area in the 12 o'clock position I recommended diagnostic right breast mammogram and ultrasound. - I have reviewed right diagnostic mammogram and ultrasound on 07/22/2022.  No evidence of new or recurrent right breast carcinoma.  Stable postlumpectomy changes in the upper outer right breast. - We will schedule her for screening mammogram after 10/22/2022. - I will follow-up after mammogram. - If the right breast firmness/lumps appear to be increasing, will consider MRI.   Follow Up Instructions:    I discussed the assessment and treatment plan with the patient. The patient was provided an opportunity to ask  questions and all were answered. The patient agreed with the plan and demonstrated an understanding of the instructions.   The patient was advised to call back or seek an in-person evaluation if the symptoms worsen or if the condition fails to improve as anticipated.  I provided 21 minutes of non-face-to-face time during this encounter.   Derek Jack, MD

## 2022-09-30 ENCOUNTER — Ambulatory Visit: Payer: BC Managed Care – PPO | Admitting: Orthopedic Surgery

## 2022-10-07 ENCOUNTER — Ambulatory Visit: Payer: BC Managed Care – PPO | Admitting: Orthopedic Surgery

## 2022-10-14 ENCOUNTER — Ambulatory Visit: Payer: BC Managed Care – PPO | Admitting: Orthopedic Surgery

## 2022-10-19 ENCOUNTER — Ambulatory Visit: Payer: BC Managed Care – PPO | Admitting: Orthopedic Surgery

## 2022-10-25 ENCOUNTER — Encounter (HOSPITAL_COMMUNITY): Payer: Self-pay

## 2022-10-25 ENCOUNTER — Other Ambulatory Visit: Payer: BC Managed Care – PPO

## 2022-10-25 ENCOUNTER — Ambulatory Visit (HOSPITAL_COMMUNITY): Payer: BC Managed Care – PPO

## 2022-10-25 ENCOUNTER — Ambulatory Visit (HOSPITAL_COMMUNITY)
Admission: RE | Admit: 2022-10-25 | Discharge: 2022-10-25 | Disposition: A | Discharge status: Home / Self Care | Payer: BC Managed Care – PPO | Source: Ambulatory Visit | Attending: Hematology | Admitting: Hematology

## 2022-10-25 DIAGNOSIS — Z1231 Encounter for screening mammogram for malignant neoplasm of breast: Secondary | ICD-10-CM | POA: Diagnosis present

## 2022-11-01 ENCOUNTER — Ambulatory Visit: Payer: BC Managed Care – PPO | Admitting: Hematology

## 2022-11-21 NOTE — Progress Notes (Signed)
[facility] [address] [place] [address]    Clinic Day:  11/22/2022  Referring physician: Kirstie Peri, MD  Patient Care Team: Kirstie Peri, MD as PCP - General (Internal Medicine) Doreatha Massed, MD as Medical Oncologist (Medical Oncology)   ASSESSMENT & PLAN:   Assessment: 1.  Stage IIa right breast TNBC: -Diagnosed on 05/21/2015, ER/PR/HER-2 negative, Ki-67 86%, grade 3 -Neoadjuvant chemotherapy with AC x4 followed by Taxol x2 cycles.  She developed reaction to Taxol and received Abraxane in place and completed on 10/28/2015. -Right lumpectomy on 12/12/2015 with complete pathological response, YPT0 YPN 0. -Germline mutation testing was negative. -Mammogram reviewed on 09/18/2019 shows BI-RADS Category 2.   2.  Osteopenia: -Bone density test on 09/18/2019 shows T score of -1.6.   3.  Peripheral neuropathy: -Chemotherapy induced numbness in the hands.  Gabapentin cause drowsiness.    Plan: 1.  Stage IIa right breast TNBC: - Physical exam: No palpable masses.  Thickening 12 o'clock position on the right breast is stable.  No palpable adenopathy. - (10/25/2022): BI-RADS Category 1. - Labs: Normal LFTs and CBC. - RTC 1 year with repeat mammogram and labs.   2.  Osteopenia: - Continue vitamin D 50,000 units weekly.   3.  Peripheral neuropathy: - Cymbalta discontinued secondary to vivid dreams.  She is taking alpha lipoid acid which is helping.  Orders Placed This Encounter  Procedures   MM 3D SCREENING MAMMOGRAM BILATERAL BREAST    NAS No implants No medical devices per office Sch'd appt with office on 11/22/22-kjones    Standing Status:   Future    Standing Expiration Date:   11/22/2023    Order Specific Question:   Reason for Exam (SYMPTOM  OR DIAGNOSIS REQUIRED)    Answer:   screening mammogram    Order Specific Question:   Preferred imaging location?    Answer:   [facility]    Order Specific Question:   Release to patient    Answer:    Immediate   CBC with Differential    Standing Status:   Future    Standing Expiration Date:   11/22/2023   Comprehensive metabolic panel    Standing Status:   Future    Standing Expiration Date:   11/22/2023   Vitamin D 25 hydroxy    Standing Status:   Future    Standing Expiration Date:   11/22/2023      I,Katie Daubenspeck,acting as a scribe for Doreatha Massed, MD.,have documented all relevant documentation on the behalf of Doreatha Massed, MD,as directed by  Doreatha Massed, MD while in the presence of Doreatha Massed, MD.   I, Doreatha Massed MD, have reviewed the above documentation for accuracy and completeness, and I agree with the above.   Doreatha Massed, MD   5/6/20247:15 PM  CHIEF COMPLAINT:   Diagnosis: right breast cancer    Cancer Staging  No matching staging information was found for the patient.   Prior Therapy: 1. Neoadjuvant chemotherapy with AC x4 followed by Taxol x2 cycles, Abraxane x2 cycles. Completed 10/28/15.  2. Right lumpectomy on 12/12/2015 with complete pathological response.  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 - DUCTAL CARCINOMA IN SITU. - SEE COMMENT. Microscopic Comment The carcinoma appears grade 3. Per outside report, the tumor cells are negative  for estrogen receptor, progesterone receptor and Her 2 neu. Ki-67 is 86%. (JBK:ds 06/17/15) JOSHUA KISH MD   06/20/2015 Imaging   MRI breast Right breast: In the upper-outer quadrant posterior 1/3 depth right breast, there is a 2.1 x 1.3 x 1.4 cm spiculated enhancing mass with washout enhancement kinetics and associated biopsy clip consistent with patient's known cancer.   06/30/2015 - 08/19/2015 Chemotherapy   Dose Dense AC x 4    09/02/2015 - 10/28/2015 Chemotherapy   Dose Dense  Taxol X 2, reaction, then changed to abraxane    11/03/2015 Imaging   MRI breast Complete imaging response to neoadjuvant chemotherapy. No mass or abnormal enhancement is seen today.   12/12/2015 Surgery   RIGHT BREAST LUMPECTOMY WITH RADIOACTIVE SEED AND SENTINEL LYMPH NODE BIOPSY REMOVAL PORT-A-CATH, Dr. Johna Sheriff   12/12/2015 Pathology Results   Breast, lumpectomy, Right - FIBROSIS, HEMOSIDERIN DEPOSITION AND FOCAL GIANT CELL REACTION. - NO RESIDUAL TUMOR. - MARGINS NOT INVOLVED. 2. Lymph node, sentinel, biopsy, Right axillary #1 - ONE BENIGN LYMPH NODE (0/1). 3. Lymph node, sentinel, biopsy, Right axillary #2 - ONE BENIGN LYMPH NODE (0/1). 4. Lymph node, sentinel, biopsy, Right axillary #3 - ONE BENIGN LYMPH NODE (0/1). 5. Lymph node, sentinel, biopsy, Right axillary #4 - ONE BENIGN LYMPH NODE (0/1).   08/03/2016 Mammogram   IMPRESSION: Lumpectomy and radiation changes of the right breast. No evidence of malignancy in either breast.   10/23/2018 Genetic Testing   Negative genetic testing on the common hereditary cancer panel.  The Hereditary Gene Panel offered by [facility] includes sequencing and/or deletion duplication testing of the following 48 genes: APC, ATM, AXIN2, BARD1, BMPR1A, BRCA1, BRCA2, BRIP1, CDH1, CDK4, CDKN2A (p14ARF), CDKN2A (p16INK4a), CHEK2, CTNNA1, DICER1, EPCAM (Deletion/duplication testing only), GREM1 (promoter region deletion/duplication testing only), KIT, MEN1, MLH1, MSH2, MSH3, MSH6, MUTYH, NBN, NF1, NHTL1, PALB2, PDGFRA, PMS2, POLD1, POLE, PTEN, RAD50, RAD51C, RAD51D, RNF43, SDHB, SDHC, SDHD, SMAD4, SMARCA4. STK11, TP53, TSC1, TSC2, and VHL.  The following genes were evaluated for sequence changes only: SDHA and HOXB13 c.251G>A variant only. The report date is October 23, 2018.      INTERVAL HISTORY:   Betty Henry is a 67 y.o. female presenting to clinic today for follow up of right breast cancer. She was last seen by me on 06/29/22, with phone visit on  07/27/22.  Since her last visit, she underwent annual mammogram on 10/25/22 showing no findings suspicious for malignancy.  Today, she states that she is doing well overall. Her appetite level is at 75%. Her energy level is at 25%.  PAST MEDICAL HISTORY:   Past Medical History: Past Medical History:  Diagnosis Date   Achilles tendon pain    Anemia    Arthritis    knees   Breast cancer (HCC)    Cancer (HCC)    right breast-already finished chemo   Cataracts, bilateral    Family history of bladder cancer    Family history of stomach cancer    GERD (gastroesophageal reflux disease)    Glaucoma    Hypothyroidism    Neuropathy    FROM CHEMO      Psoriasis    elbows, knees   Psoriasis    bil legs, elbows and hands   TFCC (triangular fibrocartilage complex) tear    left    Surgical History: Past Surgical History:  Procedure Laterality Date   ABDOMINAL HYSTERECTOMY     partial   BREAST LUMPECTOMY WITH RADIOACTIVE SEED AND SENTINEL LYMPH NODE BIOPSY Right 12/12/2015  Procedure: RIGHT BREAST LUMPECTOMY WITH RADIOACTIVE SEED AND SENTINEL LYMPH NODE BIOPSY;  Surgeon: Glenna Fellows, MD;  Location: [facility];  Service: General;  Laterality: Right;   BREAST SURGERY     BUNIONECTOMY Left    CHOLECYSTECTOMY  09/2010   CHOLECYSTECTOMY     CYST EXCISION Left    wrist   DILATION AND CURETTAGE OF UTERUS     FOOT SURGERY  2004   left   HAND TENDON SURGERY Left 2012   KNEE ARTHROSCOPY Left    x2   KNEE SURGERY  2000, 2008   left   PARTIAL HYSTERECTOMY  1989   PORT-A-CATH REMOVAL Left 12/12/2015   Procedure: REMOVAL PORT-A-CATH;  Surgeon: Glenna Fellows, MD;  Location: [facility];  Service: General;  Laterality: Left;   PORTACATH PLACEMENT Left 06/27/2015   Procedure: INSERTION PORT-A-CATH;  Surgeon: Glenna Fellows, MD;  Location: [facility];  Service: General;  Laterality: Left;   PTOSIS REPAIR Bilateral 10/21/2016   Procedure:  INTERNAL PTOSIS REPAIR;  Surgeon: Floydene Flock, MD;  Location: [facility];  Service: Plastics;  Laterality: Bilateral;   WRIST ARTHROSCOPY  06/29/2011   Procedure: ARTHROSCOPY WRIST;  Surgeon: Tami Ribas;  Location: [facility];  Service: Orthopedics;  Laterality: Left;  left wrist tfcc repair    Social History: Social History   Socioeconomic History   Marital status: Married    Spouse name: Not on file   Number of children: Not on file   Years of education: Not on file   Highest education level: Not on file  Occupational History   Not on file  Tobacco Use   Smoking status: Never   Smokeless tobacco: Never  Vaping Use   Vaping Use: Never used  Substance and Sexual Activity   Alcohol use: No   Drug use: No   Sexual activity: Yes    Comment: married  Other Topics Concern   Not on file  Social History Narrative   ** Merged History Encounter **       Social Determinants of Health   Financial Resource Strain: Not on file  Food Insecurity: Not on file  Transportation Needs: Not on file  Physical Activity: Not on file  Stress: Not on file  Social Connections: Not on file  Intimate Partner Violence: Not on file    Family History: Family History  Problem Relation Age of Onset   COPD Mother        d. 74   Emphysema Mother    Thyroid disease Mother    Diabetes Father    Hypertension Father    Stroke Father        d. 23   Kidney disease Sister        Stage III   Thyroid disease Sister    Cervical cancer Sister    Cervical cancer Maternal Grandmother    Heart attack Maternal Grandfather    Stomach cancer Paternal Grandfather    Bladder Cancer Paternal Aunt    Adrenal disorder Neg Hx     Current Medications:  Current Outpatient Medications:    acetaminophen (TYLENOL) 500 MG tablet, Take 1,000 mg by mouth 2 (two) times daily as needed for moderate pain or headache., Disp: , Rfl:    ALPHA LIPOIC ACID-BIOTIN ER PO, Take by mouth., Disp: , Rfl:     brimonidine (ALPHAGAN) 0.2 % ophthalmic solution, 1 drop 2 (two) times daily., Disp: , Rfl:    clobetasol (OLUX) 0.05 %  topical foam, APPLY TO THE AFFECTED AREA(S) A SMALL AMOUNT TWICE DAILY AS NEEDED FOR TWO WEEKS ON, TWO WEEKS OFF. DO NOT APPLY TO FACE OR BETWEEN LEGS ARE, Disp: , Rfl:    cyanocobalamin 2000 MCG tablet, Take 2,000 mcg by mouth daily., Disp: , Rfl:    latanoprost (XALATAN) 0.005 % ophthalmic solution, Place 1 drop into both eyes at bedtime., Disp: , Rfl:    levothyroxine (SYNTHROID, LEVOTHROID) 88 MCG tablet, TAKE 1 TABLET BY MOUTH ON AN EMPTY STOMACH IN THE MORNING DAILY, Disp: , Rfl: 4   omeprazole (PRILOSEC) 40 MG capsule, Take 40 mg by mouth daily. , Disp: , Rfl:    Risankizumab-rzaa (SKYRIZI) 150 MG/ML SOSY, Inject 150 mg into the skin every 3 (three) months., Disp: , Rfl:    Allergies: Allergies  Allergen Reactions   Other Swelling    RAW GREEN PEPPERS THROAT SWELLING    Paclitaxel Anaphylaxis    Due to cremaphor component most likely High fever   Camphor    Adhesive [Tape] Rash   Amoxicillin Rash    "BAD RASH"  Has patient had a PCN reaction causing immediate rash, facial/tongue/throat swelling, SOB or lightheadedness with hypotension: #  #  #  YES  #  #  #  Has patient had a PCN reaction causing severe rash involving mucus membranes or skin necrosis: No Has patient had a PCN reaction that required hospitalization No Has patient had a PCN reaction occurring within the last 10 years: #  #  #  YES  #  #  #  If all of the above answers are "NO", then may proceed with Cephalosporin use.    Codeine Nausea And Vomiting    Can take with nausea med   Hydrocodone Nausea Only    States if she takes it she has to use phenergan also   Meperidine Nausea And Vomiting    REVIEW OF SYSTEMS:   Review of Systems  Constitutional:  Negative for chills, fatigue and fever.  HENT:   Negative for lump/mass, mouth sores, nosebleeds, sore throat and trouble swallowing.   Eyes:   Negative for eye problems.  Respiratory:  Negative for cough and shortness of breath.   Cardiovascular:  Negative for chest pain, leg swelling and palpitations.  Gastrointestinal:  Negative for abdominal pain, constipation, diarrhea, nausea and vomiting.  Genitourinary:  Negative for bladder incontinence, difficulty urinating, dysuria, frequency, hematuria and nocturia.   Musculoskeletal:  Negative for arthralgias, back pain, flank pain, myalgias and neck pain.  Skin:  Negative for itching and rash.  Neurological:  Positive for numbness. Negative for dizziness and headaches.  Hematological:  Does not bruise/bleed easily.  Psychiatric/Behavioral:  Negative for depression, sleep disturbance and suicidal ideas. The patient is not nervous/anxious.   All other systems reviewed and are negative.    VITALS:   Blood pressure 125/67, pulse 88, temperature 98.2 F (36.8 C), temperature source Oral, resp. rate 18, weight 233 lb (105.7 kg), SpO2 98 %.  Wt Readings from Last 3 Encounters:  11/22/22 233 lb (105.7 kg)  06/29/22 239 lb 3.2 oz (108.5 kg)  11/03/21 241 lb 13.5 oz (109.7 kg)    Body mass index is 42.62 kg/m.  Performance status (ECOG): 1 - Symptomatic but completely ambulatory  PHYSICAL EXAM:   Physical Exam Vitals and nursing note reviewed. Exam conducted with a chaperone present.  Constitutional:      Appearance: Normal appearance.  Cardiovascular:     Rate and Rhythm: Normal rate and  regular rhythm.     Pulses: Normal pulses.     Heart sounds: Normal heart sounds.  Pulmonary:     Effort: Pulmonary effort is normal.     Breath sounds: Normal breath sounds.  Abdominal:     Palpations: Abdomen is soft. There is no hepatomegaly, splenomegaly or mass.     Tenderness: There is no abdominal tenderness.  Musculoskeletal:     Right lower leg: No edema.     Left lower leg: No edema.  Lymphadenopathy:     Cervical: No cervical adenopathy.     Right cervical: No superficial, deep  or posterior cervical adenopathy.    Left cervical: No superficial, deep or posterior cervical adenopathy.     Upper Body:     Right upper body: No supraclavicular or axillary adenopathy.     Left upper body: No supraclavicular or axillary adenopathy.  Neurological:     General: No focal deficit present.     Mental Status: She is alert and oriented to person, place, and time.  Psychiatric:        Mood and Affect: Mood normal.        Behavior: Behavior normal.     LABS:      Latest Ref Rng & Units 11/22/2022    1:10 PM 10/21/2021    8:58 AM 04/01/2021    9:54 AM  CBC  WBC 4.0 - 10.5 K/uL 6.5  5.2  6.4   Hemoglobin 12.0 - 15.0 g/dL 16.1  09.6  04.5   Hematocrit 36.0 - 46.0 % 42.6  41.3  42.8   Platelets 150 - 400 K/uL 294  268  296       Latest Ref Rng & Units 11/22/2022    1:10 PM 10/21/2021    8:58 AM 04/01/2021    9:54 AM  CMP  Glucose 70 - 99 mg/dL 409  811  914   BUN 8 - 23 mg/dL 17  22  23    Creatinine 0.44 - 1.00 mg/dL 7.82  9.56  2.13   Sodium 135 - 145 mmol/L 137  138  138   Potassium 3.5 - 5.1 mmol/L 3.9  4.1  4.2   Chloride 98 - 111 mmol/L 105  106  108   CO2 22 - 32 mmol/L 23  23  25    Calcium 8.9 - 10.3 mg/dL 9.7  9.2  9.1   Total Protein 6.5 - 8.1 g/dL 7.5  7.2  7.5   Total Bilirubin 0.3 - 1.2 mg/dL 0.8  0.4  0.7   Alkaline Phos 38 - 126 U/L 51  46  55   AST 15 - 41 U/L 23  20  21    ALT 0 - 44 U/L 30  23  26       No results found for: "CEA1", "CEA" / No results found for: "CEA1", "CEA" No results found for: "PSA1" No results found for: "YQM578" No results found for: "CAN125"  No results found for: "TOTALPROTELP", "ALBUMINELP", "A1GS", "A2GS", "BETS", "BETA2SER", "GAMS", "MSPIKE", "SPEI" No results found for: "TIBC", "FERRITIN", "IRONPCTSAT" No results found for: "LDH"   STUDIES:   MM 3D SCREENING MAMMOGRAM BILATERAL BREAST  Result Date: 10/26/2022 CLINICAL DATA:  Screening. EXAM: DIGITAL SCREENING BILATERAL MAMMOGRAM WITH TOMOSYNTHESIS AND CAD TECHNIQUE:  Bilateral screening digital craniocaudal and mediolateral oblique mammograms were obtained. Bilateral screening digital breast tomosynthesis was performed. The images were evaluated with computer-aided detection. COMPARISON:  Previous exam(s). ACR Breast Density Category b: There are scattered areas of  fibroglandular density. FINDINGS: There are no findings suspicious for malignancy. IMPRESSION: No mammographic evidence of malignancy. A result letter of this screening mammogram will be mailed directly to the patient. RECOMMENDATION: Screening mammogram in one year. (Code:SM-B-01Y) BI-RADS CATEGORY  1: Negative. Electronically Signed   By: Sande Brothers M.D.   On: 10/26/2022 10:15

## 2022-11-22 ENCOUNTER — Inpatient Hospital Stay: Payer: BC Managed Care – PPO | Admitting: Hematology

## 2022-11-22 ENCOUNTER — Inpatient Hospital Stay: Payer: BC Managed Care – PPO | Attending: Hematology

## 2022-11-22 VITALS — BP 125/67 | HR 88 | Temp 98.2°F | Resp 18 | Wt 233.0 lb

## 2022-11-22 DIAGNOSIS — C50919 Malignant neoplasm of unspecified site of unspecified female breast: Secondary | ICD-10-CM

## 2022-11-22 DIAGNOSIS — Z79899 Other long term (current) drug therapy: Secondary | ICD-10-CM | POA: Diagnosis not present

## 2022-11-22 DIAGNOSIS — Z853 Personal history of malignant neoplasm of breast: Secondary | ICD-10-CM | POA: Diagnosis present

## 2022-11-22 DIAGNOSIS — Z9221 Personal history of antineoplastic chemotherapy: Secondary | ICD-10-CM | POA: Insufficient documentation

## 2022-11-22 LAB — CBC WITH DIFFERENTIAL/PLATELET
Abs Immature Granulocytes: 0.02 10*3/uL (ref 0.00–0.07)
Basophils Absolute: 0.1 10*3/uL (ref 0.0–0.1)
Basophils Relative: 1 %
Eosinophils Absolute: 0.1 10*3/uL (ref 0.0–0.5)
Eosinophils Relative: 2 %
HCT: 42.6 % (ref 36.0–46.0)
Hemoglobin: 14.5 g/dL (ref 12.0–15.0)
Immature Granulocytes: 0 %
Lymphocytes Relative: 32 %
Lymphs Abs: 2.1 10*3/uL (ref 0.7–4.0)
MCH: 32.4 pg (ref 26.0–34.0)
MCHC: 34 g/dL (ref 30.0–36.0)
MCV: 95.1 fL (ref 80.0–100.0)
Monocytes Absolute: 0.4 10*3/uL (ref 0.1–1.0)
Monocytes Relative: 6 %
Neutro Abs: 3.9 10*3/uL (ref 1.7–7.7)
Neutrophils Relative %: 59 %
Platelets: 294 10*3/uL (ref 150–400)
RBC: 4.48 MIL/uL (ref 3.87–5.11)
RDW: 12.7 % (ref 11.5–15.5)
WBC: 6.5 10*3/uL (ref 4.0–10.5)
nRBC: 0 % (ref 0.0–0.2)

## 2022-11-22 LAB — COMPREHENSIVE METABOLIC PANEL
ALT: 30 U/L (ref 0–44)
AST: 23 U/L (ref 15–41)
Albumin: 4.2 g/dL (ref 3.5–5.0)
Alkaline Phosphatase: 51 U/L (ref 38–126)
Anion gap: 9 (ref 5–15)
BUN: 17 mg/dL (ref 8–23)
CO2: 23 mmol/L (ref 22–32)
Calcium: 9.7 mg/dL (ref 8.9–10.3)
Chloride: 105 mmol/L (ref 98–111)
Creatinine, Ser: 0.75 mg/dL (ref 0.44–1.00)
GFR, Estimated: >60 mL/min (ref >60)
Glucose, Bld: 141 mg/dL — ABNORMAL HIGH (ref 70–99)
Potassium: 3.9 mmol/L (ref 3.5–5.1)
Sodium: 137 mmol/L (ref 135–145)
Total Bilirubin: 0.8 mg/dL (ref 0.3–1.2)
Total Protein: 7.5 g/dL (ref 6.5–8.1)

## 2022-11-22 NOTE — Patient Instructions (Addendum)
[facility]  Discharge Instructions  You were seen and examined today by Dr. Ellin Saba.  Your labs are stable.  Your recent mammogram is normal - no signs of cancer. Continue yearly mammograms.  Continue Vitamin D as prescribed.  Follow-up as scheduled.  Thank you for choosing [facility] to provide your oncology and hematology care.   To afford each patient quality time with our provider, please arrive at least 15 minutes before your scheduled appointment time. You may need to reschedule your appointment if you arrive late (10 or more minutes). Arriving late affects you and other patients whose appointments are after yours.  Also, if you miss three or more appointments without notifying the office, you may be dismissed from the clinic at the provider's discretion.    Again, thank you for choosing [facility].  Our hope is that these requests will decrease the amount of time that you wait before being seen by our physicians.   If you have a lab appointment with the Cancer Center - please note that after April 8th, all labs will be drawn in the cancer center.  You do not have to check in or register with the main entrance as you have in the past but will complete your check-in at the cancer center.            _____________________________________________________________  Should you have questions after your visit to [facility], please contact our office at [phone] and follow the prompts.  Our office hours are 8:00 a.m. to 4:30 p.m. Monday - Thursday and 8:00 a.m. to 2:30 p.m. Friday.  Please note that voicemails left after 4:00 p.m. may not be returned until the following business day.  We are closed weekends and all major holidays.  You do have access to a nurse 24-7, just call the main number to the clinic [phone] and do not press any options, hold on the line and a nurse will answer the phone.     For prescription refill requests, have your pharmacy contact our office and allow 72 hours.    Masks are no longer required in the cancer centers. If you would like for your care team to wear a mask while they are taking care of you, please let them know. You may have one support person who is at least 68 years old accompany you for your appointments.

## 2023-08-04 ENCOUNTER — Other Ambulatory Visit (INDEPENDENT_AMBULATORY_CARE_PROVIDER_SITE_OTHER): Payer: BC Managed Care – PPO

## 2023-08-04 ENCOUNTER — Ambulatory Visit: Payer: BC Managed Care – PPO | Admitting: Orthopedic Surgery

## 2023-08-04 DIAGNOSIS — M25561 Pain in right knee: Secondary | ICD-10-CM

## 2023-08-04 DIAGNOSIS — G8929 Other chronic pain: Secondary | ICD-10-CM | POA: Diagnosis not present

## 2023-08-04 DIAGNOSIS — M25562 Pain in left knee: Secondary | ICD-10-CM

## 2023-08-04 DIAGNOSIS — M1712 Unilateral primary osteoarthritis, left knee: Secondary | ICD-10-CM | POA: Diagnosis not present

## 2023-08-12 ENCOUNTER — Encounter: Payer: Self-pay | Admitting: Orthopedic Surgery

## 2023-08-12 NOTE — Progress Notes (Signed)
Office Visit Note   Patient: Betty Henry           Date of Birth: Jul 17, 1956           MRN: [id number] Visit Date: 08/04/2023              Requested by: Kirstie Peri, MD [address] [place],  [place] [address] PCP: Kirstie Peri, MD  Chief Complaint  Patient presents with   Right Knee - Pain   Left Knee - Pain      HPI: Patient is a 68 year old woman who is seen for evaluation for bilateral knee pain left worse than right.  Patient states she has had steroid injections in both knees and is status post arthroscopic debridement of the left knee x 2.  Patient states she cannot go up or down steps at this time.  Patient states she has not fallen but she states she has come close.  She states that both knees buckle with global pain and decreased range of motion.  Assessment & Plan: Visit Diagnoses:  1. Chronic pain of both knees   2. Unilateral primary osteoarthritis, left knee     Plan: Discussed operative versus nonoperative intervention for the osteoarthritis of the left knee.  Patient states she would like to proceed with a left total knee arthroplasty.  Risk and benefits were discussed including infection neurovascular injury persistent pain need for additional surgery.  Patient states she understands wished to proceed with outpatient surgery.  Follow-Up Instructions: Return if symptoms worsen or fail to improve.   Ortho Exam  Patient is alert, oriented, no adenopathy, well-dressed, normal affect, normal respiratory effort. Examination patient has crepitation with range of motion of both knees.  There is varus alignment to the left knee with tenderness to palpation over the medial and lateral joint line as well as the patellofemoral joint.  Collaterals and cruciates are stable.  She does have full extension.  Imaging: No results found. No images are attached to the encounter.  Labs: No results found for: "HGBA1C", "ESRSEDRATE", "CRP", "LABURIC", "REPTSTATUS", "GRAMSTAIN", "CULT",  "LABORGA"   Lab Results  Component Value Date   ALBUMIN 4.2 11/22/2022   ALBUMIN 3.8 10/21/2021   ALBUMIN 4.2 04/01/2021    No results found for: "MG" Lab Results  Component Value Date   VD25OH 50.68 10/21/2021   VD25OH 76.55 09/23/2020   VD25OH 73.61 04/07/2020    No results found for: "PREALBUMIN"    Latest Ref Rng & Units 11/22/2022    1:10 PM 10/21/2021    8:58 AM 04/01/2021    9:54 AM  CBC EXTENDED  WBC 4.0 - 10.5 K/uL 6.5  5.2  6.4   RBC 3.87 - 5.11 MIL/uL 4.48  4.30  4.35   Hemoglobin 12.0 - 15.0 g/dL 19.1  47.8  29.5   HCT 36.0 - 46.0 % 42.6  41.3  42.8   Platelets 150 - 400 K/uL 294  268  296   NEUT# 1.7 - 7.7 K/uL 3.9  2.6  3.9   Lymph# 0.7 - 4.0 K/uL 2.1  2.0  1.7      There is no height or weight on file to calculate BMI.  Orders:  Orders Placed This Encounter  Procedures   XR Knee 1-2 Views Right   XR Knee 1-2 Views Left   No orders of the defined types were placed in this encounter.    Procedures: No procedures performed  Clinical Data: No additional findings.  ROS:  All other  systems negative, except as noted in the HPI. Review of Systems  Objective: Vital Signs: There were no vitals taken for this visit.  Specialty Comments:  No specialty comments available.  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 76   Emphysema Mother    Thyroid disease Mother    Diabetes Father    Hypertension Father    Stroke Father        d. 15   Kidney disease Sister        Stage III   Thyroid disease Sister    Cervical cancer Sister    Cervical cancer Maternal Grandmother    Heart attack Maternal Grandfather    Stomach cancer Paternal Grandfather    Bladder Cancer Paternal Aunt    Adrenal disorder Neg Hx     Past Surgical History:  Procedure Laterality Date   ABDOMINAL HYSTERECTOMY     partial   BREAST LUMPECTOMY WITH RADIOACTIVE SEED AND SENTINEL LYMPH NODE BIOPSY Right 12/12/2015   Procedure: RIGHT BREAST LUMPECTOMY WITH RADIOACTIVE SEED AND SENTINEL LYMPH NODE BIOPSY;  Surgeon: Glenna Fellows, MD;  Location: [facility];  Service: General;  Laterality: Right;   BREAST SURGERY     BUNIONECTOMY Left    CHOLECYSTECTOMY  09/2010   CHOLECYSTECTOMY     CYST EXCISION Left    wrist   DILATION AND CURETTAGE OF UTERUS     FOOT SURGERY  2004   left   HAND TENDON SURGERY Left 2012   KNEE ARTHROSCOPY Left    x2   KNEE SURGERY  2000, 2008   left   PARTIAL HYSTERECTOMY  1989   PORT-A-CATH REMOVAL Left 12/12/2015   Procedure: REMOVAL PORT-A-CATH;  Surgeon: Glenna Fellows, MD;  Location: [facility];  Service: General;  Laterality: Left;   PORTACATH PLACEMENT Left 06/27/2015   Procedure: INSERTION PORT-A-CATH;  Surgeon: Glenna Fellows, MD;  Location: [facility];  Service: General;  Laterality: Left;   PTOSIS REPAIR Bilateral 10/21/2016   Procedure: INTERNAL PTOSIS REPAIR;  Surgeon: Floydene Flock, MD;  Location: [facility];  Service: Plastics;  Laterality: Bilateral;   WRIST ARTHROSCOPY  06/29/2011   Procedure: ARTHROSCOPY WRIST;  Surgeon: Tami Ribas;  Location: [facility];  Service: Orthopedics;  Laterality: Left;  left wrist tfcc repair   Social History   Occupational History   Not on file  Tobacco Use  Smoking status: Never   Smokeless tobacco: Never  Vaping Use   Vaping status: Never Used  Substance and Sexual Activity   Alcohol use: No   Drug use: No   Sexual activity: Yes    Comment: married

## 2023-10-28 ENCOUNTER — Encounter (HOSPITAL_COMMUNITY): Payer: Self-pay

## 2023-10-28 ENCOUNTER — Ambulatory Visit (HOSPITAL_COMMUNITY)
Admission: RE | Admit: 2023-10-28 | Discharge: 2023-10-28 | Disposition: A | Discharge status: Home / Self Care | Payer: BC Managed Care – PPO | Source: Ambulatory Visit | Attending: Hematology | Admitting: Hematology

## 2023-10-28 ENCOUNTER — Inpatient Hospital Stay: Payer: BC Managed Care – PPO | Attending: Hematology

## 2023-10-28 DIAGNOSIS — Z923 Personal history of irradiation: Secondary | ICD-10-CM | POA: Diagnosis not present

## 2023-10-28 DIAGNOSIS — Z9221 Personal history of antineoplastic chemotherapy: Secondary | ICD-10-CM | POA: Insufficient documentation

## 2023-10-28 DIAGNOSIS — Z853 Personal history of malignant neoplasm of breast: Secondary | ICD-10-CM | POA: Diagnosis present

## 2023-10-28 DIAGNOSIS — E559 Vitamin D deficiency, unspecified: Secondary | ICD-10-CM | POA: Diagnosis not present

## 2023-10-28 DIAGNOSIS — Z17421 Hormone receptor negative with human epidermal growth factor receptor 2 negative status: Secondary | ICD-10-CM

## 2023-10-28 DIAGNOSIS — Z1231 Encounter for screening mammogram for malignant neoplasm of breast: Secondary | ICD-10-CM | POA: Diagnosis present

## 2023-10-28 LAB — COMPREHENSIVE METABOLIC PANEL WITH GFR
ALT: 24 U/L (ref 0–44)
AST: 21 U/L (ref 15–41)
Albumin: 3.9 g/dL (ref 3.5–5.0)
Alkaline Phosphatase: 47 U/L (ref 38–126)
Anion gap: 12 (ref 5–15)
BUN: 15 mg/dL (ref 8–23)
CO2: 21 mmol/L — ABNORMAL LOW (ref 22–32)
Calcium: 10.3 mg/dL (ref 8.9–10.3)
Chloride: 103 mmol/L (ref 98–111)
Creatinine, Ser: 0.67 mg/dL (ref 0.44–1.00)
GFR, Estimated: >60 mL/min (ref >60)
Glucose, Bld: 100 mg/dL — ABNORMAL HIGH (ref 70–99)
Potassium: 4.3 mmol/L (ref 3.5–5.1)
Sodium: 136 mmol/L (ref 135–145)
Total Bilirubin: 0.5 mg/dL (ref 0.0–1.2)
Total Protein: 7.6 g/dL (ref 6.5–8.1)

## 2023-10-28 LAB — CBC WITH DIFFERENTIAL/PLATELET
Abs Immature Granulocytes: 0.01 10*3/uL (ref 0.00–0.07)
Basophils Absolute: 0.1 10*3/uL (ref 0.0–0.1)
Basophils Relative: 1 %
Eosinophils Absolute: 0.3 10*3/uL (ref 0.0–0.5)
Eosinophils Relative: 4 %
HCT: 42.9 % (ref 36.0–46.0)
Hemoglobin: 14.5 g/dL (ref 12.0–15.0)
Immature Granulocytes: 0 %
Lymphocytes Relative: 29 %
Lymphs Abs: 2 10*3/uL (ref 0.7–4.0)
MCH: 31.9 pg (ref 26.0–34.0)
MCHC: 33.8 g/dL (ref 30.0–36.0)
MCV: 94.3 fL (ref 80.0–100.0)
Monocytes Absolute: 0.5 10*3/uL (ref 0.1–1.0)
Monocytes Relative: 8 %
Neutro Abs: 4 10*3/uL (ref 1.7–7.7)
Neutrophils Relative %: 58 %
Platelets: 292 10*3/uL (ref 150–400)
RBC: 4.55 MIL/uL (ref 3.87–5.11)
RDW: 12.9 % (ref 11.5–15.5)
WBC: 6.8 10*3/uL (ref 4.0–10.5)
nRBC: 0 % (ref 0.0–0.2)

## 2023-10-28 LAB — VITAMIN D 25 HYDROXY (VIT D DEFICIENCY, FRACTURES): Vit D, 25-Hydroxy: 69.99 ng/mL (ref 30–100)

## 2023-11-07 ENCOUNTER — Inpatient Hospital Stay: Payer: BC Managed Care – PPO | Admitting: Hematology

## 2023-11-07 VITALS — BP 137/82 | HR 67 | Temp 98.2°F | Resp 18 | Wt 226.4 lb

## 2023-11-07 DIAGNOSIS — Z17421 Hormone receptor negative with human epidermal growth factor receptor 2 negative status: Secondary | ICD-10-CM

## 2023-11-07 DIAGNOSIS — C50919 Malignant neoplasm of unspecified site of unspecified female breast: Secondary | ICD-10-CM | POA: Diagnosis not present

## 2023-11-07 DIAGNOSIS — Z853 Personal history of malignant neoplasm of breast: Secondary | ICD-10-CM | POA: Diagnosis not present

## 2023-11-07 NOTE — Progress Notes (Signed)
 [facility] [address] [place] [address]    Clinic Day:  11/07/2023  Referring physician: Theoplis Fix, MD  Patient Care Team: Theoplis Fix, MD as PCP - General (Internal Medicine) Paulett Boros, MD as Medical Oncologist (Medical Oncology)   ASSESSMENT & PLAN:   Assessment: 1.  Stage IIa right breast TNBC: -Diagnosed on 05/21/2015, ER/PR/HER-2 negative, Ki-67 86%, grade 3 -Neoadjuvant chemotherapy with AC x4 followed by Taxol x2 cycles.  She developed reaction to Taxol and received Abraxane in place and completed on 10/28/2015. -Right lumpectomy on 12/12/2015 with complete pathological response, YPT0 YPN 0. -Germline mutation testing was negative. -Mammogram reviewed on 09/18/2019 shows BI-RADS Category 2.   2.  Osteopenia: -Bone density test on 09/18/2019 shows T score of -1.6.   3.  Peripheral neuropathy: -Chemotherapy induced numbness in the hands.  Gabapentin  cause drowsiness.    Plan: 1.  Stage IIa right breast TNBC: - Physical exam: No palpable masses in the bilateral breasts.  Right breast lumpectomy scar in the upper outer quadrant is unchanged.  No palpable adenopathy. - Mammogram from 10/28/2023: BI-RADS Category 1. - Labs from 10/28/2023: Normal LFTs and CBC. - RTC 1 year for follow-up with repeat mammogram and labs.   2.  Osteopenia: - Vitamin D  level is 69.  She is taking vitamin D  50,000 units weekly.   3.  Peripheral neuropathy: - Cymbalta  discontinued secondary to vivid dreams.  She is taking alpha lipoic acid which is helping.  Orders Placed This Encounter  Procedures   MM 3D SCREENING MAMMOGRAM BILATERAL BREAST    Nas No implants NMD-[facility]    Standing Status:   Future    Expected Date:   10/29/2024    Expiration Date:   11/06/2024    Reason for Exam (SYMPTOM  OR DIAGNOSIS REQUIRED):   breast cancer screening    Preferred imaging location?:   [facility]   CBC with Differential    Standing Status:   Future     Expected Date:   10/29/2024    Expiration Date:   11/06/2024   Comprehensive metabolic panel    Standing Status:   Future    Expected Date:   10/29/2024    Expiration Date:   11/06/2024   VITAMIN D  25 Hydroxy (Vit-D Deficiency, Fractures)    Standing Status:   Future    Expected Date:   10/29/2024    Expiration Date:   11/06/2024      Betty Henry,acting as a scribe for Paulett Boros, MD.,have documented all relevant documentation on the behalf of Paulett Boros, MD,as directed by  Paulett Boros, MD while in the presence of Paulett Boros, MD.  I, Paulett Boros MD, have reviewed the above documentation for accuracy and completeness, and I agree with the above.    Paulett Boros, MD   4/21/20253:54 PM  CHIEF COMPLAINT:   Diagnosis: right breast cancer    Cancer Staging  No matching staging information was found for the patient.    Prior Therapy: 1. Neoadjuvant chemotherapy with AC x4 followed by Taxol x2 cycles, Abraxane x2 cycles. Completed 10/28/15.  2. Right lumpectomy on 12/12/2015 with complete pathological response.  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 - DUCTAL CARCINOMA IN SITU. - SEE COMMENT. Microscopic Comment The carcinoma appears grade 3. Per  outside report, the tumor cells are negative for estrogen receptor, progesterone receptor and Her 2 neu. Ki-67 is 86%. (JBK:ds 06/17/15) JOSHUA KISH MD   06/20/2015 Imaging   MRI breast Right breast: In the upper-outer quadrant posterior 1/3 depth right breast, there is a 2.1 x 1.3 x 1.4 cm spiculated enhancing mass with washout enhancement kinetics and associated biopsy clip consistent with patient's known cancer.   06/30/2015 - 08/19/2015 Chemotherapy   Dose Dense AC x 4    09/02/2015 - 10/28/2015  Chemotherapy   Dose Dense Taxol X 2, reaction, then changed to abraxane    11/03/2015 Imaging   MRI breast Complete imaging response to neoadjuvant chemotherapy. No mass or abnormal enhancement is seen today.   12/12/2015 Surgery   RIGHT BREAST LUMPECTOMY WITH RADIOACTIVE SEED AND SENTINEL LYMPH NODE BIOPSY REMOVAL PORT-A-CATH, Dr. Alray Askew   12/12/2015 Pathology Results   Breast, lumpectomy, Right - FIBROSIS, HEMOSIDERIN DEPOSITION AND FOCAL GIANT CELL REACTION. - NO RESIDUAL TUMOR. - MARGINS NOT INVOLVED. 2. Lymph node, sentinel, biopsy, Right axillary #1 - ONE BENIGN LYMPH NODE (0/1). 3. Lymph node, sentinel, biopsy, Right axillary #2 - ONE BENIGN LYMPH NODE (0/1). 4. Lymph node, sentinel, biopsy, Right axillary #3 - ONE BENIGN LYMPH NODE (0/1). 5. Lymph node, sentinel, biopsy, Right axillary #4 - ONE BENIGN LYMPH NODE (0/1).   08/03/2016 Mammogram   IMPRESSION: Lumpectomy and radiation changes of the right breast. No evidence of malignancy in either breast.   10/23/2018 Genetic Testing   Negative genetic testing on the common hereditary cancer panel.  The Hereditary Gene Panel offered by [facility] includes sequencing and/or deletion duplication testing of the following 48 genes: APC, ATM, AXIN2, BARD1, BMPR1A, BRCA1, BRCA2, BRIP1, CDH1, CDK4, CDKN2A (p14ARF), CDKN2A (p16INK4a), CHEK2, CTNNA1, DICER1, EPCAM (Deletion/duplication testing only), GREM1 (promoter region deletion/duplication testing only), KIT, MEN1, MLH1, MSH2, MSH3, MSH6, MUTYH, NBN, NF1, NHTL1, PALB2, PDGFRA, PMS2, POLD1, POLE, PTEN, RAD50, RAD51C, RAD51D, RNF43, SDHB, SDHC, SDHD, SMAD4, SMARCA4. STK11, TP53, TSC1, TSC2, and VHL.  The following genes were evaluated for sequence changes only: SDHA and HOXB13 c.251G>A variant only. The report date is October 23, 2018.      INTERVAL HISTORY:   Betty Henry is a 68 y.o. female presenting to clinic today for follow up of right breast cancer. She was last seen by me on  11/22/22.  Since her last visit, she underwent annual mammogram on 10/28/23 that found: No mammographic evidence of malignancy.   Today, she states that she is doing well overall. Her appetite level is at 100%. Her energy level is at 75%.  PAST MEDICAL HISTORY:   Past Medical History: Past Medical History:  Diagnosis Date   Achilles tendon pain    Anemia    Arthritis    knees   Breast cancer (HCC) 2017   right   Cataracts, bilateral    Family history of bladder cancer    Family history of stomach cancer    GERD (gastroesophageal reflux disease)    Glaucoma    Hypothyroidism    Neuropathy    FROM CHEMO      Personal history of chemotherapy 2017   Personal history of radiation therapy 2017   Psoriasis    elbows, knees   Psoriasis    bil legs, elbows and hands   TFCC (triangular fibrocartilage complex) tear    left    Surgical History: Past Surgical History:  Procedure Laterality Date   ABDOMINAL HYSTERECTOMY     partial   BREAST LUMPECTOMY  Right 2017   BREAST LUMPECTOMY WITH RADIOACTIVE SEED AND SENTINEL LYMPH NODE BIOPSY Right 12/12/2015   Procedure: RIGHT BREAST LUMPECTOMY WITH RADIOACTIVE SEED AND SENTINEL LYMPH NODE BIOPSY;  Surgeon: Ayesha Lente, MD;  Location: [facility];  Service: General;  Laterality: Right;   BUNIONECTOMY Left    CHOLECYSTECTOMY  09/2010   CHOLECYSTECTOMY     CYST EXCISION Left    wrist   DILATION AND CURETTAGE OF UTERUS     FOOT SURGERY  2004   left   HAND TENDON SURGERY Left 2012   KNEE ARTHROSCOPY Left    x2   KNEE SURGERY  2000, 2008   left   PARTIAL HYSTERECTOMY  1989   PORT-A-CATH REMOVAL Left 12/12/2015   Procedure: REMOVAL PORT-A-CATH;  Surgeon: Ayesha Lente, MD;  Location: [facility];  Service: General;  Laterality: Left;   PORTACATH PLACEMENT Left 06/27/2015   Procedure: INSERTION PORT-A-CATH;  Surgeon: Ayesha Lente, MD;  Location: [facility];  Service: General;   Laterality: Left;   PTOSIS REPAIR Bilateral 10/21/2016   Procedure: INTERNAL PTOSIS REPAIR;  Surgeon: Karan Osgood, MD;  Location: [facility];  Service: Plastics;  Laterality: Bilateral;   WRIST ARTHROSCOPY  06/29/2011   Procedure: ARTHROSCOPY WRIST;  Surgeon: Milagros Alf;  Location: [facility];  Service: Orthopedics;  Laterality: Left;  left wrist tfcc repair    Social History: Social History   Socioeconomic History   Marital status: Married    Spouse name: Not on file   Number of children: Not on file   Years of education: Not on file   Highest education level: Not on file  Occupational History   Not on file  Tobacco Use   Smoking status: Never   Smokeless tobacco: Never  Vaping Use   Vaping status: Never Used  Substance and Sexual Activity   Alcohol use: No   Drug use: No   Sexual activity: Yes    Comment: married  Other Topics Concern   Not on file  Social History Narrative   ** Merged History Encounter **       Social Drivers of Corporate investment banker Strain: Not on file  Food Insecurity: Not on file  Transportation Needs: Not on file  Physical Activity: Not on file  Stress: Not on file  Social Connections: Not on file  Intimate Partner Violence: Not on file    Family History: Family History  Problem Relation Age of Onset   COPD Mother        d. 79   Emphysema Mother    Thyroid  disease Mother    Diabetes Father    Hypertension Father    Stroke Father        d. 48   Kidney disease Sister        Stage III   Thyroid  disease Sister    Cervical cancer Sister    Cervical cancer Maternal Grandmother    Heart attack Maternal Grandfather    Stomach cancer Paternal Grandfather    Bladder Cancer Paternal Aunt    Adrenal disorder Neg Hx     Current Medications:  Current Outpatient Medications:    acetaminophen  (TYLENOL ) 500 MG tablet, Take 1,000 mg by mouth 2 (two) times daily as needed for moderate pain or headache., Disp: , Rfl:     alendronate (FOSAMAX) 70 MG tablet, Take 70 mg by mouth once a week., Disp: , Rfl:    ALPHA LIPOIC ACID-BIOTIN ER  PO, Take by mouth., Disp: , Rfl:    brimonidine (ALPHAGAN) 0.2 % ophthalmic solution, 1 drop 2 (two) times daily., Disp: , Rfl:    clobetasol (OLUX) 0.05 % topical foam, APPLY TO THE AFFECTED AREA(S) A SMALL AMOUNT TWICE DAILY AS NEEDED FOR TWO WEEKS ON, TWO WEEKS OFF. DO NOT APPLY TO FACE OR BETWEEN LEGS ARE, Disp: , Rfl:    cyanocobalamin 2000 MCG tablet, Take 2,000 mcg by mouth daily., Disp: , Rfl:    latanoprost (XALATAN) 0.005 % ophthalmic solution, Place 1 drop into both eyes at bedtime., Disp: , Rfl:    levothyroxine (SYNTHROID, LEVOTHROID) 88 MCG tablet, TAKE 1 TABLET BY MOUTH ON AN EMPTY STOMACH IN THE MORNING DAILY, Disp: , Rfl: 4   omeprazole (PRILOSEC) 40 MG capsule, Take 40 mg by mouth daily. , Disp: , Rfl:    Risankizumab-rzaa (SKYRIZI) 150 MG/ML SOSY, Inject 150 mg into the skin every 3 (three) months., Disp: , Rfl:    Vitamin D , Ergocalciferol , (DRISDOL) 1.25 MG (50000 UNIT) CAPS capsule, Take 50,000 Units by mouth once a week., Disp: , Rfl:    Allergies: Allergies  Allergen Reactions   Other Swelling    RAW GREEN PEPPERS THROAT SWELLING    Paclitaxel Anaphylaxis    Due to cremaphor component most likely High fever   Camphor    Adhesive [Tape] Rash   Amoxicillin Rash    "BAD RASH"  Has patient had a PCN reaction causing immediate rash, facial/tongue/throat swelling, SOB or lightheadedness with hypotension: #  #  #  YES  #  #  #  Has patient had a PCN reaction causing severe rash involving mucus membranes or skin necrosis: No Has patient had a PCN reaction that required hospitalization No Has patient had a PCN reaction occurring within the last 10 years: #  #  #  YES  #  #  #  If all of the above answers are "NO", then may proceed with Cephalosporin use.    Codeine  Nausea And Vomiting    Can take with nausea med   Hydrocodone  Nausea Only    States if  she takes it she has to use phenergan  also   Meperidine Nausea And Vomiting    REVIEW OF SYSTEMS:   Review of Systems  Constitutional:  Negative for chills, fatigue and fever.  HENT:   Negative for lump/mass, mouth sores, nosebleeds, sore throat and trouble swallowing.   Eyes:  Negative for eye problems.  Respiratory:  Positive for shortness of breath. Negative for cough.   Cardiovascular:  Negative for chest pain, leg swelling and palpitations.  Gastrointestinal:  Negative for abdominal pain, constipation, diarrhea, nausea and vomiting.  Genitourinary:  Negative for bladder incontinence, difficulty urinating, dysuria, frequency, hematuria and nocturia.   Musculoskeletal:  Negative for arthralgias, back pain, flank pain, myalgias and neck pain.  Skin:  Negative for itching and rash.  Neurological:  Positive for dizziness. Negative for headaches and numbness.  Hematological:  Does not bruise/bleed easily.  Psychiatric/Behavioral:  Negative for depression, sleep disturbance and suicidal ideas. The patient is not nervous/anxious.   All other systems reviewed and are negative.    VITALS:   Blood pressure 137/82, pulse 67, temperature 98.2 F (36.8 C), temperature source Oral, resp. rate 18, weight 226 lb 6.6 oz (102.7 kg), SpO2 96%.  Wt Readings from Last 3 Encounters:  11/07/23 226 lb 6.6 oz (102.7 kg)  11/22/22 233 lb (105.7 kg)  06/29/22 239 lb 3.2 oz (108.5 kg)  Body mass index is 41.41 kg/m.  Performance status (ECOG): 1 - Symptomatic but completely ambulatory  PHYSICAL EXAM:   Physical Exam Vitals and nursing note reviewed. Exam conducted with a chaperone present.  Constitutional:      Appearance: Normal appearance.  Cardiovascular:     Rate and Rhythm: Normal rate and regular rhythm.     Pulses: Normal pulses.     Heart sounds: Normal heart sounds.  Pulmonary:     Effort: Pulmonary effort is normal.     Breath sounds: Normal breath sounds.  Abdominal:      Palpations: Abdomen is soft. There is no hepatomegaly, splenomegaly or mass.     Tenderness: There is no abdominal tenderness.  Musculoskeletal:     Right lower leg: No edema.     Left lower leg: No edema.  Lymphadenopathy:     Cervical: No cervical adenopathy.     Right cervical: No superficial, deep or posterior cervical adenopathy.    Left cervical: No superficial, deep or posterior cervical adenopathy.     Upper Body:     Right upper body: No supraclavicular or axillary adenopathy.     Left upper body: No supraclavicular or axillary adenopathy.  Neurological:     General: No focal deficit present.     Mental Status: She is alert and oriented to person, place, and time.  Psychiatric:        Mood and Affect: Mood normal.        Behavior: Behavior normal.     LABS:      Latest Ref Rng & Units 10/28/2023   10:37 AM 11/22/2022    1:10 PM 10/21/2021    8:58 AM  CBC  WBC 4.0 - 10.5 K/uL 6.8  6.5  5.2   Hemoglobin 12.0 - 15.0 g/dL 16.1  09.6  04.5   Hematocrit 36.0 - 46.0 % 42.9  42.6  41.3   Platelets 150 - 400 K/uL 292  294  268       Latest Ref Rng & Units 10/28/2023   10:37 AM 11/22/2022    1:10 PM 10/21/2021    8:58 AM  CMP  Glucose 70 - 99 mg/dL 409  811  914   BUN 8 - 23 mg/dL 15  17  22    Creatinine 0.44 - 1.00 mg/dL 7.82  9.56  2.13   Sodium 135 - 145 mmol/L 136  137  138   Potassium 3.5 - 5.1 mmol/L 4.3  3.9  4.1   Chloride 98 - 111 mmol/L 103  105  106   CO2 22 - 32 mmol/L 21  23  23    Calcium 8.9 - 10.3 mg/dL 08.6  9.7  9.2   Total Protein 6.5 - 8.1 g/dL 7.6  7.5  7.2   Total Bilirubin 0.0 - 1.2 mg/dL 0.5  0.8  0.4   Alkaline Phos 38 - 126 U/L 47  51  46   AST 15 - 41 U/L 21  23  20    ALT 0 - 44 U/L 24  30  23       No results found for: "CEA1", "CEA" / No results found for: "CEA1", "CEA" No results found for: "PSA1" No results found for: "VHQ469" No results found for: "CAN125"  No results found for: "TOTALPROTELP", "ALBUMINELP", "A1GS", "A2GS", "BETS",  "BETA2SER", "GAMS", "MSPIKE", "SPEI" No results found for: "TIBC", "FERRITIN", "IRONPCTSAT" No results found for: "LDH"   STUDIES:   MM 3D SCREENING MAMMOGRAM BILATERAL BREAST Result Date:  11/02/2023 CLINICAL DATA:  Screening. EXAM: DIGITAL SCREENING BILATERAL MAMMOGRAM WITH TOMOSYNTHESIS AND CAD TECHNIQUE: Bilateral screening digital craniocaudal and mediolateral oblique mammograms were obtained. Bilateral screening digital breast tomosynthesis was performed. The images were evaluated with computer-aided detection. COMPARISON:  Previous exam(s). ACR Breast Density Category b: There are scattered areas of fibroglandular density. FINDINGS: There are no findings suspicious for malignancy. IMPRESSION: No mammographic evidence of malignancy. A result letter of this screening mammogram will be mailed directly to the patient. RECOMMENDATION: Screening mammogram in one year. (Code:SM-B-01Y) BI-RADS CATEGORY  1: Negative. Electronically Signed   By: Roda Cirri M.D.   On: 11/02/2023 11:34

## 2023-11-07 NOTE — Patient Instructions (Signed)
 [facility] at [facility] Discharge Instructions   You were seen and examined today by Dr. Cheree Cords.  He reviewed the results of your lab work which are normal/stable.   He reviewed the results of your mammogram which did not show any evidence of cancer.   We will see you back in one year. We will repeat lab work and a mammogram prior to that.    Return as scheduled.    Thank you for choosing [facility] at [facility] to provide your oncology and hematology care.  To afford each patient quality time with our provider, please arrive at least 15 minutes before your scheduled appointment time.   If you have a lab appointment with the Cancer Center please come in thru the Main Entrance and check in at the main information desk.  You need to re-schedule your appointment should you arrive 10 or more minutes late.  We strive to give you quality time with our providers, and arriving late affects you and other patients whose appointments are after yours.  Also, if you no show three or more times for appointments you may be dismissed from the clinic at the providers discretion.     Again, thank you for choosing [facility].  Our hope is that these requests will decrease the amount of time that you wait before being seen by our physicians.       _____________________________________________________________  Should you have questions after your visit to [facility], please contact our office at [phone] and follow the prompts.  Our office hours are 8:00 a.m. and 4:30 p.m. Monday - Friday.  Please note that voicemails left after 4:00 p.m. may not be returned until the following business day.  We are closed weekends and major holidays.  You do have access to a nurse 24-7, just call the main number to the clinic [phone] and do not press any options, hold on the line and a nurse will answer the phone.    For prescription  refill requests, have your pharmacy contact our office and allow 72 hours.    Due to Covid, you will need to wear a mask upon entering the hospital. If you do not have a mask, a mask will be given to you at the Main Entrance upon arrival. For doctor visits, patients may have 1 support person age 68 or older with them. For treatment visits, patients can not have anyone with them due to social distancing guidelines and our immunocompromised population.

## 2023-11-22 ENCOUNTER — Other Ambulatory Visit: Payer: BC Managed Care – PPO

## 2023-11-22 ENCOUNTER — Ambulatory Visit: Payer: BC Managed Care – PPO | Admitting: Hematology

## 2024-03-15 ENCOUNTER — Ambulatory Visit: Admitting: Orthopedic Surgery

## 2024-03-15 ENCOUNTER — Other Ambulatory Visit (INDEPENDENT_AMBULATORY_CARE_PROVIDER_SITE_OTHER): Payer: Self-pay

## 2024-03-15 DIAGNOSIS — M1811 Unilateral primary osteoarthritis of first carpometacarpal joint, right hand: Secondary | ICD-10-CM

## 2024-03-15 DIAGNOSIS — M79644 Pain in right finger(s): Secondary | ICD-10-CM | POA: Diagnosis not present

## 2024-03-15 MED ORDER — LIDOCAINE HCL 1 % IJ SOLN
1.0000 mL | INTRAMUSCULAR | Status: AC | PRN
Start: 2024-03-15 — End: 2024-03-15
  Administered 2024-03-15: 1 mL

## 2024-03-15 MED ORDER — BETAMETHASONE SOD PHOS & ACET 6 (3-3) MG/ML IJ SUSP
6.0000 mg | INTRAMUSCULAR | Status: AC | PRN
Start: 2024-03-15 — End: 2024-03-15
  Administered 2024-03-15: 6 mg via INTRA_ARTICULAR

## 2024-03-15 NOTE — Progress Notes (Signed)
 Betty Henry - 68 y.o. female MRN [id number]  Date of birth: [birth year]  Office Visit Note: Visit Date: 03/15/2024 PCP: Maree Isles, MD Referred by: Maree Isles, MD  Subjective: No chief complaint on file.  HPI: Betty Henry is a pleasant 68 y.o. female who presents today for evaluation of ongoing right thumb basilar joint pain that has been present now for multiple weeks to months, worsening in nature.  She has not trialed any significant treatment modalities to date.  She is left-hand dominant.  She is overall active at baseline.  Denies any significant numbness or tingling.  She was seen by myself today for specific hand surgical evaluation.  Pertinent ROS were reviewed with the patient and found to be negative unless otherwise specified above in HPI.   Visit Reason: Right thumb basal joint pain Duration of symptoms:3-4 weeks Hand dominance: left Occupation: Retired Diabetic: No Smoking: No Heart/Lung History:none Blood Thinners: none  Prior Testing/EMG:none Injections (Date):none Treatments:none Prior Surgery:none  Assessment & Plan: Visit Diagnoses:  1. Arthritis of carpometacarpal (CMC) joint of right thumb   2. Thumb pain, right     Plan: Extensive discussion was had with the patient today regarding her right thumb basilar joint pain.  X-rays today confirm diagnosis of ongoing right thumb CMC arthritis which correlates with her clinical examination.  We reviewed the etiology and pathophysiology of this condition as well as appropriate treatment modalities ranging from conservative to surgical.  From a conservative standpoint, we discussed bracing, activity modification, nonsteroidal anti-inflammatory medications both oral and topical, and finally cortisone injections.  From surgical standpoint, we discussed the possibility for thumb [facility] arthroplasty in the future should symptoms refractory to conservative care.  I discussed the surgical treatment as well as the postop  protocol in detail with her as well today for her understanding.  At this juncture, given that she has not undergone any formalized treatment, we will have her fitted for a Comfort Cool brace and will perform cortisone injection to the right thumb CMC interval today for symptom relief.  After trialing the Comfort Cool brace, she elected to forego this and will simply buy an over-the-counter brace.  Risk and benefits of the injection were discussed in detail, patient elected to proceed.  I spent 45 minutes in the care of this patient today including review of previous documentation, imaging obtained, face-to-face time discussing all options regarding treatment and documenting the encounter.    Follow-up: No follow-ups on file.   Meds & Orders: No orders of the defined types were placed in this encounter.   Orders Placed This Encounter  Procedures   Hand/UE Inj   XR Wrist Complete Right     Procedures: Hand/UE Inj: R thumb CMC for osteoarthritis on 03/15/2024 9:43 PM Indications: pain Details: 25 G needle Medications: 1 mL lidocaine  1 %; 6 mg betamethasone  acetate-betamethasone  sodium phosphate  6 (3-3) MG/ML Outcome: tolerated well, no immediate complications Procedure, treatment alternatives, risks and benefits explained, specific risks discussed.          Clinical History: No specialty comments available.  She reports that she has never smoked. She has never used smokeless tobacco. No results for input(s): HGBA1C, LABURIC in the last 8760 hours.  Objective:   Vital Signs: There were no vitals taken for this visit.  Physical Exam  Gen: Well-appearing, in no acute distress; non-toxic CV: Regular Rate. Well-perfused. Warm.  Resp: Breathing unlabored on room air; no wheezing. Psych: Fluid speech in conversation; appropriate affect; normal thought process  Ortho Exam General: Patient is well appearing and in no distress.   Skin and Muscle: No skin changes are apparent to  upper extremities.  Muscle bulk and contour normal, no signs of atrophy.      Range of Motion and Palpation Tests: Mobility is full about the elbows with flexion and extension.  Forearm supination and pronation are 85/85 bilaterally.  Wrist flexion/extension is 75/65 bilaterally.  Digital flexion and extension are full.  Thumb opposition is full to the base of the small fingers bilaterally.     No cords or nodules are palpated.  No triggering is observed.     Significant tenderness over the right thumb CMC articulation is observed, positive grind for pain, positive crepitus.  MP hyperextension negative.    Finklestein test negative   Neurologic, Vascular, Motor: Sensation is intact to light touch in the median/radial/ulnar distributions.  Tinel's testing negative at wrist level.  Fingers pink and well perfused.  Capillary refill is brisk.     Imaging: XR Wrist Complete Right Result Date: 03/15/2024 X-rays of the right wrist demonstrate degenerative changes at the thumb [facility] interval with joint space narrowing, osteophyte formation and subchondral sclerosis.   Past Medical/Family/Surgical/Social History: Medications & Allergies reviewed per EMR, new medications updated. 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   Past Medical History:  Diagnosis Date   Achilles tendon pain    Anemia    Arthritis    knees   Breast cancer (HCC) 2017   right   Cataracts, bilateral    Family history of bladder cancer    Family history of stomach cancer    GERD (gastroesophageal reflux disease)    Glaucoma    Hypothyroidism    Neuropathy    FROM CHEMO      Personal history of chemotherapy 2017   Personal history of radiation therapy 2017   Psoriasis    elbows, knees    Psoriasis    bil legs, elbows and hands   TFCC (triangular fibrocartilage complex) tear    left   Family History  Problem Relation Age of Onset   COPD Mother        d. 39   Emphysema Mother    Thyroid  disease Mother    Diabetes Father    Hypertension Father    Stroke Father        d. 19   Kidney disease Sister        Stage III   Thyroid  disease Sister    Cervical cancer Sister    Cervical cancer Maternal Grandmother    Heart attack Maternal Grandfather    Stomach cancer Paternal Grandfather    Bladder Cancer Paternal Aunt    Adrenal disorder Neg Hx    Past Surgical History:  Procedure Laterality Date   ABDOMINAL HYSTERECTOMY     partial   BREAST LUMPECTOMY Right 2017   BREAST LUMPECTOMY WITH RADIOACTIVE SEED AND SENTINEL LYMPH NODE BIOPSY Right 12/12/2015   Procedure: RIGHT BREAST LUMPECTOMY WITH RADIOACTIVE SEED AND SENTINEL LYMPH NODE BIOPSY;  Surgeon: Morene Olives, MD;  Location: [facility];  Service: General;  Laterality: Right;   BUNIONECTOMY Left    CHOLECYSTECTOMY  09/2010   CHOLECYSTECTOMY     CYST EXCISION Left  wrist   DILATION AND CURETTAGE OF UTERUS     FOOT SURGERY  2004   left   HAND TENDON SURGERY Left 2012   KNEE ARTHROSCOPY Left    x2   KNEE SURGERY  2000, 2008   left   PARTIAL HYSTERECTOMY  1989   PORT-A-CATH REMOVAL Left 12/12/2015   Procedure: REMOVAL PORT-A-CATH;  Surgeon: Morene Olives, MD;  Location: [facility];  Service: General;  Laterality: Left;   PORTACATH PLACEMENT Left 06/27/2015   Procedure: INSERTION PORT-A-CATH;  Surgeon: Morene Olives, MD;  Location: [facility];  Service: General;  Laterality: Left;   PTOSIS REPAIR Bilateral 10/21/2016   Procedure: INTERNAL PTOSIS REPAIR;  Surgeon: Loyd Kathryne Palm, MD;  Location: [facility];  Service: Plastics;  Laterality: Bilateral;   WRIST ARTHROSCOPY  06/29/2011   Procedure: ARTHROSCOPY WRIST;  Surgeon: Franky JONELLE Curia;  Location: [facility];  Service: Orthopedics;  Laterality: Left;  left wrist tfcc repair   Social History   Occupational History   Not on file  Tobacco Use   Smoking status: Never   Smokeless tobacco: Never  Vaping Use   Vaping status: Never Used  Substance and Sexual Activity   Alcohol use: No   Drug use: No   Sexual activity: Yes    Comment: married    Nikhita Mentzel Estela) Arlinda, M.D. [facility], Hand Surgery

## 2024-04-26 ENCOUNTER — Ambulatory Visit: Admitting: Orthopedic Surgery

## 2024-04-26 DIAGNOSIS — M1811 Unilateral primary osteoarthritis of first carpometacarpal joint, right hand: Secondary | ICD-10-CM | POA: Diagnosis not present

## 2024-04-26 NOTE — Progress Notes (Signed)
 Betty Henry - 68 y.o. female MRN [id number]  Date of birth: Sep 19, 1955  Office Visit Note: Visit Date: 04/26/2024 PCP: Maree Isles, MD Referred by: Maree Isles, MD  Subjective: No chief complaint on file.  HPI: Betty Henry is a pleasant 68 y.o. female who returns today for evaluation of ongoing right thumb basilar joint pain that has been present now for multiple weeks to months, worsening in nature.  She underwent cortisone injection to the right thumb CMC region at her most recent visit approximately 6 weeks prior with moderate relief.  Pertinent ROS were reviewed with the patient and found to be negative unless otherwise specified above in HPI.   Visit Reason: Right thumb basal joint pain Duration of symptoms:3-4 weeks Hand dominance: left Occupation: Retired Diabetic: No Smoking: No Heart/Lung History:none Blood Thinners: none  Prior Testing/EMG:none Injections (Date):none Treatments:none Prior Surgery:none  Assessment & Plan: Visit Diagnoses:  1. Arthritis of carpometacarpal (CMC) joint of right thumb      Plan: She is doing well overall, I am glad to see that the cortisone injection has helped her for the right thumb CMC arthritis.  Unfortunate last time we did not have an appropriate size Comfort Cool brace for her, today we do have the appropriate sizing, this was fitted on her today.  I recommend that she return as needed in the future should symptoms recur or persist.  She expressed understanding.    Follow-up: No follow-ups on file.   Meds & Orders: No orders of the defined types were placed in this encounter.   No orders of the defined types were placed in this encounter.    Procedures: No procedures performed      Clinical History: No specialty comments available.  She reports that she has never smoked. She has never used smokeless tobacco. No results for input(s): HGBA1C, LABURIC in the last 8760 hours.  Objective:   Vital Signs: There were  no vitals taken for this visit.  Physical Exam  Gen: Well-appearing, in no acute distress; non-toxic CV: Regular Rate. Well-perfused. Warm.  Resp: Breathing unlabored on room air; no wheezing. Psych: Fluid speech in conversation; appropriate affect; normal thought process  Ortho Exam General: Patient is well appearing and in no distress.   Skin and Muscle: No skin changes are apparent to upper extremities.  Muscle bulk and contour normal, no signs of atrophy.      Range of Motion and Palpation Tests: Mobility is full about the elbows with flexion and extension.  Forearm supination and pronation are 85/85 bilaterally.  Wrist flexion/extension is 75/65 bilaterally.  Digital flexion and extension are full.  Thumb opposition is full to the base of the small fingers bilaterally.     No cords or nodules are palpated.  No triggering is observed.     Mild tenderness over the right thumb CMC articulation is observed, positive grind for pain, positive crepitus.  MP hyperextension negative.    Finklestein test negative   Neurologic, Vascular, Motor: Sensation is intact to light touch in the median/radial/ulnar distributions.  Tinel's testing negative at wrist level.  Fingers pink and well perfused.  Capillary refill is brisk.     Imaging: No results found.   Past Medical/Family/Surgical/Social History: Medications & Allergies reviewed per EMR, new medications updated. 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   Past Medical History:  Diagnosis Date   Achilles tendon pain    Anemia    Arthritis    knees   Breast cancer (HCC) 2017   right   Cataracts, bilateral    Family history of bladder cancer    Family history of stomach cancer    GERD  (gastroesophageal reflux disease)    Glaucoma    Hypothyroidism    Neuropathy    FROM CHEMO      Personal history of chemotherapy 2017   Personal history of radiation therapy 2017   Psoriasis    elbows, knees   Psoriasis    bil legs, elbows and hands   TFCC (triangular fibrocartilage complex) tear    left   Family History  Problem Relation Age of Onset   COPD Mother        d. 38   Emphysema Mother    Thyroid  disease Mother    Diabetes Father    Hypertension Father    Stroke Father        d. 41   Kidney disease Sister        Stage III   Thyroid  disease Sister    Cervical cancer Sister    Cervical cancer Maternal Grandmother    Heart attack Maternal Grandfather    Stomach cancer Paternal Grandfather    Bladder Cancer Paternal Aunt    Adrenal disorder Neg Hx    Past Surgical History:  Procedure Laterality Date   ABDOMINAL HYSTERECTOMY     partial   BREAST LUMPECTOMY Right 2017   BREAST LUMPECTOMY WITH RADIOACTIVE SEED AND SENTINEL LYMPH NODE BIOPSY Right 12/12/2015   Procedure: RIGHT BREAST LUMPECTOMY WITH RADIOACTIVE SEED AND SENTINEL LYMPH NODE BIOPSY;  Surgeon: Morene Olives, MD;  Location: [facility];  Service: General;  Laterality: Right;   BUNIONECTOMY Left    CHOLECYSTECTOMY  09/2010   CHOLECYSTECTOMY     CYST EXCISION Left    wrist   DILATION AND CURETTAGE OF UTERUS     FOOT SURGERY  2004   left   HAND TENDON SURGERY Left 2012   KNEE ARTHROSCOPY Left    x2   KNEE SURGERY  2000, 2008   left   PARTIAL HYSTERECTOMY  1989   PORT-A-CATH REMOVAL Left 12/12/2015   Procedure: REMOVAL PORT-A-CATH;  Surgeon: Morene Olives, MD;  Location: [facility];  Service: General;  Laterality: Left;   PORTACATH PLACEMENT Left 06/27/2015   Procedure: INSERTION PORT-A-CATH;  Surgeon: Morene Olives, MD;  Location: [facility];  Service: General;  Laterality: Left;   PTOSIS REPAIR Bilateral 10/21/2016   Procedure: INTERNAL  PTOSIS REPAIR;  Surgeon: Loyd Kathryne Palm, MD;  Location: [facility];  Service: Plastics;  Laterality: Bilateral;   WRIST ARTHROSCOPY  06/29/2011   Procedure: ARTHROSCOPY WRIST;  Surgeon: Franky JONELLE Curia;  Location: [facility];  Service: Orthopedics;  Laterality: Left;  left wrist tfcc repair   Social History   Occupational History   Not on file  Tobacco Use   Smoking status: Never   Smokeless tobacco: Never  Vaping Use   Vaping status: Never Used  Substance and Sexual Activity   Alcohol use: No   Drug use: No   Sexual activity: Yes    Comment: married    Dyane Broberg Estela) Arlinda, M.D. [facility], Hand Surgery

## 2024-05-21 ENCOUNTER — Encounter: Payer: Self-pay | Admitting: Radiology

## 2024-05-23 ENCOUNTER — Telehealth: Payer: Self-pay | Admitting: *Deleted

## 2024-05-23 NOTE — Telephone Encounter (Signed)
 Received referral from W[place]ell M[place], NP with [facility] Internal Medicine~ [phone] telephone/ [phone]~ fax  Reason for referral: hiatal hernia  Denied referral as [facility] does not operate on hiatal hernias.

## 2024-10-29 ENCOUNTER — Ambulatory Visit (HOSPITAL_COMMUNITY)

## 2024-10-29 ENCOUNTER — Other Ambulatory Visit

## 2024-11-05 ENCOUNTER — Ambulatory Visit: Admitting: Physician Assistant
